# Patient Record
Sex: Female | Born: 1965
Health system: Southern US, Community
[De-identification: ages and names within clinical notes are randomized; demographics above are authoritative.]

## PROBLEM LIST (undated history)

## (undated) DIAGNOSIS — F32A Depression, unspecified: Secondary | ICD-10-CM

## (undated) DIAGNOSIS — N760 Acute vaginitis: Secondary | ICD-10-CM

## (undated) DIAGNOSIS — K5790 Diverticulosis of intestine, part unspecified, without perforation or abscess without bleeding: Secondary | ICD-10-CM

## (undated) DIAGNOSIS — E785 Hyperlipidemia, unspecified: Secondary | ICD-10-CM

## (undated) DIAGNOSIS — E079 Disorder of thyroid, unspecified: Secondary | ICD-10-CM

## (undated) DIAGNOSIS — J45909 Unspecified asthma, uncomplicated: Secondary | ICD-10-CM

## (undated) DIAGNOSIS — I10 Essential (primary) hypertension: Secondary | ICD-10-CM

## (undated) DIAGNOSIS — E039 Hypothyroidism, unspecified: Secondary | ICD-10-CM

## (undated) DIAGNOSIS — R238 Other skin changes: Secondary | ICD-10-CM

## (undated) DIAGNOSIS — F419 Anxiety disorder, unspecified: Secondary | ICD-10-CM

## (undated) DIAGNOSIS — Z309 Encounter for contraceptive management, unspecified: Secondary | ICD-10-CM

## (undated) DIAGNOSIS — N898 Other specified noninflammatory disorders of vagina: Principal | ICD-10-CM

## (undated) DIAGNOSIS — B9689 Other specified bacterial agents as the cause of diseases classified elsewhere: Secondary | ICD-10-CM

## (undated) DIAGNOSIS — M1990 Unspecified osteoarthritis, unspecified site: Secondary | ICD-10-CM

## (undated) DIAGNOSIS — M722 Plantar fascial fibromatosis: Secondary | ICD-10-CM

## (undated) DIAGNOSIS — K219 Gastro-esophageal reflux disease without esophagitis: Secondary | ICD-10-CM

## (undated) DIAGNOSIS — T7840XA Allergy, unspecified, initial encounter: Secondary | ICD-10-CM

## (undated) DIAGNOSIS — G2581 Restless legs syndrome: Secondary | ICD-10-CM

## (undated) DIAGNOSIS — E559 Vitamin D deficiency, unspecified: Secondary | ICD-10-CM

## (undated) HISTORY — PX: CARPAL TUNNEL RELEASE: SHX101

## (undated) HISTORY — DX: Other specified noninflammatory disorders of vagina: N89.8

## (undated) HISTORY — DX: Essential (primary) hypertension: I10

## (undated) HISTORY — DX: Gastro-esophageal reflux disease without esophagitis: K21.9

## (undated) HISTORY — DX: Unspecified osteoarthritis, unspecified site: M19.90

## (undated) HISTORY — PX: SHOULDER ARTHROSCOPY: SHX128

## (undated) HISTORY — DX: Plantar fascial fibromatosis: M72.2

## (undated) HISTORY — PX: OTHER SURGICAL HISTORY: SHX169

## (undated) HISTORY — DX: Vitamin D deficiency, unspecified: E55.9

## (undated) HISTORY — DX: Other skin changes: R23.8

## (undated) HISTORY — DX: Other specified bacterial agents as the cause of diseases classified elsewhere: B96.89

## (undated) HISTORY — DX: Hypothyroidism, unspecified: E03.9

## (undated) HISTORY — DX: Encounter for contraceptive management, unspecified: Z30.9

## (undated) HISTORY — PX: UPPER GASTROINTESTINAL ENDOSCOPY: SHX188

## (undated) HISTORY — PX: WISDOM TOOTH EXTRACTION: SHX21

## (undated) HISTORY — DX: Hyperlipidemia, unspecified: E78.5

## (undated) HISTORY — DX: Depression, unspecified: F32.A

## (undated) HISTORY — DX: Disorder of thyroid, unspecified: E07.9

## (undated) HISTORY — DX: Unspecified asthma, uncomplicated: J45.909

## (undated) HISTORY — DX: Diverticulosis of intestine, part unspecified, without perforation or abscess without bleeding: K57.90

## (undated) HISTORY — DX: Allergy, unspecified, initial encounter: T78.40XA

## (undated) HISTORY — DX: Anxiety disorder, unspecified: F41.9

## (undated) HISTORY — DX: Restless legs syndrome: G25.81

## (undated) HISTORY — PX: ELBOW ARTHROSCOPY: SUR87

## (undated) HISTORY — PX: THYROIDECTOMY: SHX17

## (undated) HISTORY — DX: Acute vaginitis: N76.0

## (undated) HISTORY — PX: COLONOSCOPY: SHX174

---

## 2001-07-10 ENCOUNTER — Other Ambulatory Visit: Admission: RE | Admit: 2001-07-10 | Discharge: 2001-07-10 | Payer: Self-pay | Admitting: Obstetrics and Gynecology

## 2003-12-04 ENCOUNTER — Encounter: Admission: RE | Admit: 2003-12-04 | Discharge: 2003-12-04 | Payer: Self-pay | Admitting: Gastroenterology

## 2004-08-17 ENCOUNTER — Ambulatory Visit (HOSPITAL_COMMUNITY): Admission: RE | Admit: 2004-08-17 | Discharge: 2004-08-17 | Payer: Self-pay | Admitting: Obstetrics and Gynecology

## 2004-10-11 DIAGNOSIS — K449 Diaphragmatic hernia without obstruction or gangrene: Secondary | ICD-10-CM

## 2004-10-11 HISTORY — DX: Diaphragmatic hernia without obstruction or gangrene: K44.9

## 2004-11-11 ENCOUNTER — Encounter (INDEPENDENT_AMBULATORY_CARE_PROVIDER_SITE_OTHER): Payer: Self-pay | Admitting: Specialist

## 2004-11-11 ENCOUNTER — Ambulatory Visit (HOSPITAL_COMMUNITY): Admission: RE | Admit: 2004-11-11 | Discharge: 2004-11-11 | Payer: Self-pay | Admitting: Gastroenterology

## 2005-05-11 ENCOUNTER — Encounter: Admission: RE | Admit: 2005-05-11 | Discharge: 2005-05-11 | Payer: Self-pay | Admitting: Family Medicine

## 2005-08-19 ENCOUNTER — Ambulatory Visit (HOSPITAL_COMMUNITY): Admission: RE | Admit: 2005-08-19 | Discharge: 2005-08-19 | Payer: Self-pay | Admitting: Obstetrics and Gynecology

## 2006-09-12 ENCOUNTER — Ambulatory Visit (HOSPITAL_COMMUNITY): Admission: RE | Admit: 2006-09-12 | Discharge: 2006-09-12 | Payer: Self-pay | Admitting: Obstetrics and Gynecology

## 2006-09-26 ENCOUNTER — Ambulatory Visit (HOSPITAL_COMMUNITY): Admission: RE | Admit: 2006-09-26 | Discharge: 2006-09-26 | Payer: Self-pay | Admitting: Obstetrics & Gynecology

## 2007-09-18 ENCOUNTER — Ambulatory Visit (HOSPITAL_COMMUNITY): Admission: RE | Admit: 2007-09-18 | Discharge: 2007-09-18 | Payer: Self-pay | Admitting: Obstetrics and Gynecology

## 2007-11-27 ENCOUNTER — Other Ambulatory Visit: Admission: RE | Admit: 2007-11-27 | Discharge: 2007-11-27 | Payer: Self-pay | Admitting: Obstetrics and Gynecology

## 2008-09-30 ENCOUNTER — Ambulatory Visit (HOSPITAL_COMMUNITY): Admission: RE | Admit: 2008-09-30 | Discharge: 2008-09-30 | Payer: Self-pay | Admitting: Obstetrics and Gynecology

## 2008-12-10 ENCOUNTER — Other Ambulatory Visit: Admission: RE | Admit: 2008-12-10 | Discharge: 2008-12-10 | Payer: Self-pay | Admitting: Obstetrics and Gynecology

## 2009-10-14 ENCOUNTER — Ambulatory Visit (HOSPITAL_COMMUNITY): Admission: RE | Admit: 2009-10-14 | Discharge: 2009-10-14 | Payer: Self-pay | Admitting: Orthopaedic Surgery

## 2010-02-04 ENCOUNTER — Other Ambulatory Visit: Admission: RE | Admit: 2010-02-04 | Discharge: 2010-02-04 | Payer: Self-pay | Admitting: Obstetrics and Gynecology

## 2010-02-20 ENCOUNTER — Encounter: Admission: RE | Admit: 2010-02-20 | Discharge: 2010-02-20 | Payer: Self-pay | Admitting: Internal Medicine

## 2010-05-08 ENCOUNTER — Encounter (INDEPENDENT_AMBULATORY_CARE_PROVIDER_SITE_OTHER): Payer: Self-pay | Admitting: Surgery

## 2010-05-08 ENCOUNTER — Observation Stay (HOSPITAL_COMMUNITY): Admission: AD | Admit: 2010-05-08 | Discharge: 2010-05-09 | Payer: Self-pay | Admitting: Surgery

## 2010-08-12 ENCOUNTER — Encounter: Admission: RE | Admit: 2010-08-12 | Discharge: 2010-08-12 | Payer: Self-pay | Admitting: Orthopaedic Surgery

## 2010-10-30 ENCOUNTER — Other Ambulatory Visit: Payer: Self-pay | Admitting: Obstetrics and Gynecology

## 2010-10-30 DIAGNOSIS — Z1239 Encounter for other screening for malignant neoplasm of breast: Secondary | ICD-10-CM

## 2010-11-30 ENCOUNTER — Ambulatory Visit (HOSPITAL_COMMUNITY)
Admission: RE | Admit: 2010-11-30 | Discharge: 2010-11-30 | Disposition: A | Discharge status: Home / Self Care | Payer: 59 | Source: Ambulatory Visit | Attending: Obstetrics and Gynecology | Admitting: Obstetrics and Gynecology

## 2010-11-30 DIAGNOSIS — Z1239 Encounter for other screening for malignant neoplasm of breast: Secondary | ICD-10-CM

## 2010-11-30 DIAGNOSIS — Z1231 Encounter for screening mammogram for malignant neoplasm of breast: Secondary | ICD-10-CM | POA: Insufficient documentation

## 2010-12-26 LAB — URINALYSIS, ROUTINE W REFLEX MICROSCOPIC
Glucose, UA: NEGATIVE mg/dL
Ketones, ur: NEGATIVE mg/dL
Protein, ur: NEGATIVE mg/dL
Urobilinogen, UA: 0.2 mg/dL (ref 0.0–1.0)
pH: 6 (ref 5.0–8.0)

## 2010-12-26 LAB — CALCIUM
Calcium: 7.9 mg/dL — ABNORMAL LOW (ref 8.4–10.5)
Calcium: 8.7 mg/dL (ref 8.4–10.5)

## 2010-12-26 LAB — CBC
HCT: 37.2 % (ref 36.0–46.0)
MCHC: 35.5 g/dL (ref 30.0–36.0)
Platelets: 436 10*3/uL — ABNORMAL HIGH (ref 150–400)
RDW: 12.5 % (ref 11.5–15.5)

## 2010-12-26 LAB — BASIC METABOLIC PANEL
Chloride: 104 mEq/L (ref 96–112)
GFR calc Af Amer: >60 mL/min (ref >60)
Glucose, Bld: 88 mg/dL (ref 70–99)
Potassium: 3.1 mEq/L — ABNORMAL LOW (ref 3.5–5.1)
Sodium: 138 mEq/L (ref 135–145)

## 2010-12-26 LAB — DIFFERENTIAL
Basophils Absolute: 0 10*3/uL (ref 0.0–0.1)
Basophils Relative: 1 % (ref 0–1)
Eosinophils Relative: 1 % (ref 0–5)
Lymphs Abs: 2.2 10*3/uL (ref 0.7–4.0)
Monocytes Relative: 6 % (ref 3–12)
Neutro Abs: 5.1 10*3/uL (ref 1.7–7.7)
Neutrophils Relative %: 65 % (ref 43–77)

## 2010-12-26 LAB — PROTIME-INR: Prothrombin Time: 12.5 seconds (ref 11.6–15.2)

## 2010-12-26 LAB — SURGICAL PCR SCREEN: Staphylococcus aureus: NEGATIVE

## 2011-01-05 IMAGING — US US SOFT TISSUE HEAD/NECK
1 series · 14 of 25 positions shown · non-contrast
Comparison: 

CLINICAL DATA: Follow-up multinodular goiter.  Dysphagia

THYROID ULTRASOUND
TECHNIQUE: Ultrasound examination of the thyroid gland and
adjacent soft tissues was performed.

[Series 1: us soft tissue head/neck · 0.09mm/px · 14 of 47 slices shown]
[im 1/47]
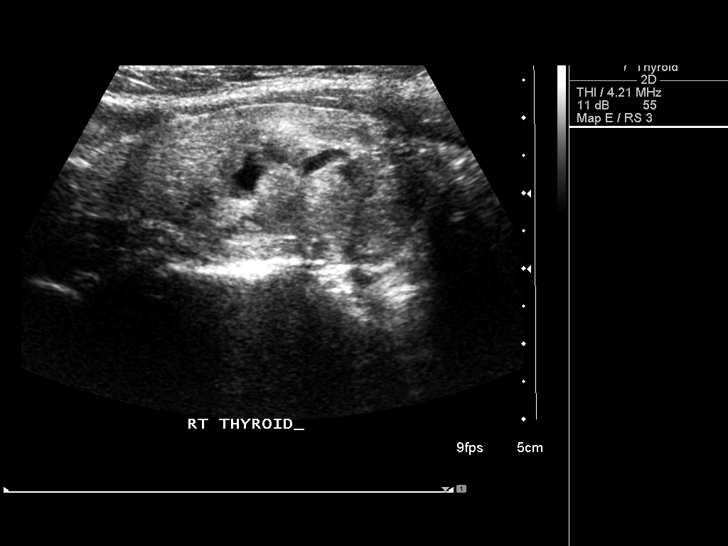
[im 4/47]
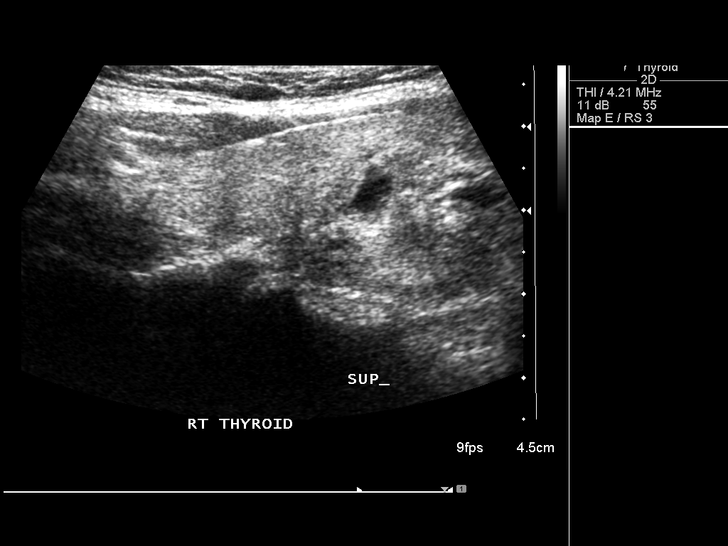
[im 8/47]
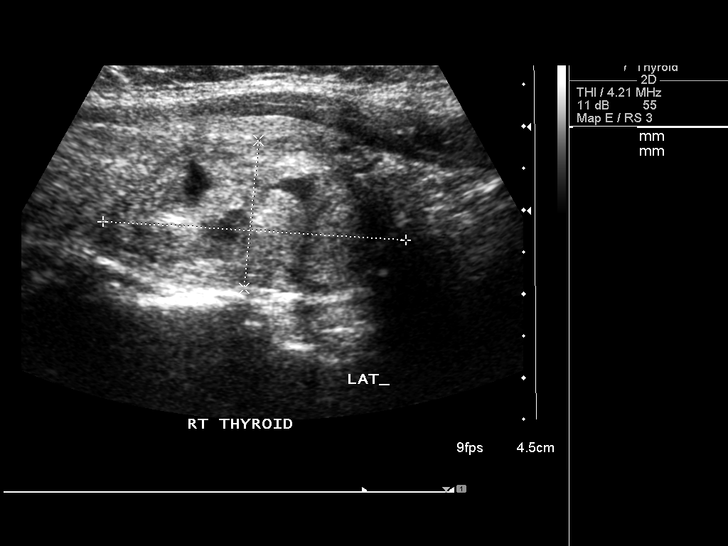
[im 12/47]
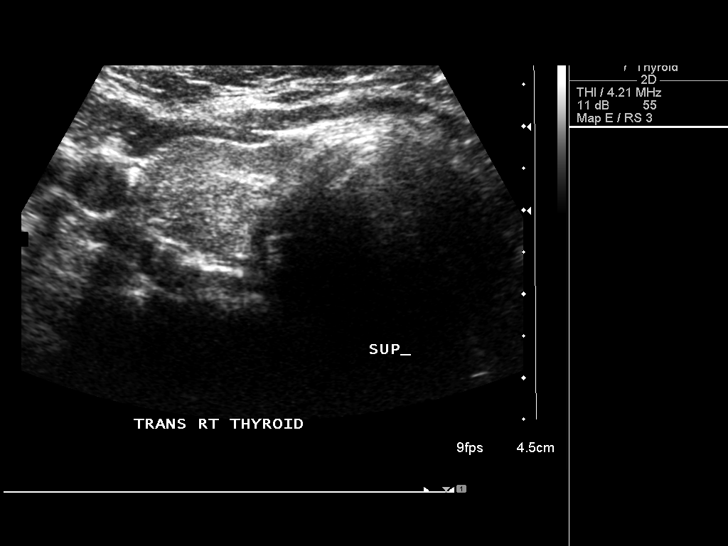
[im 16/47]
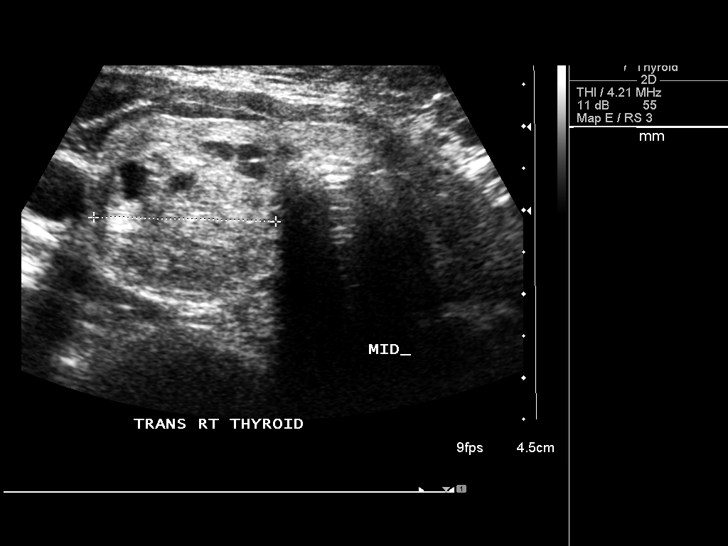
[im 18/47]
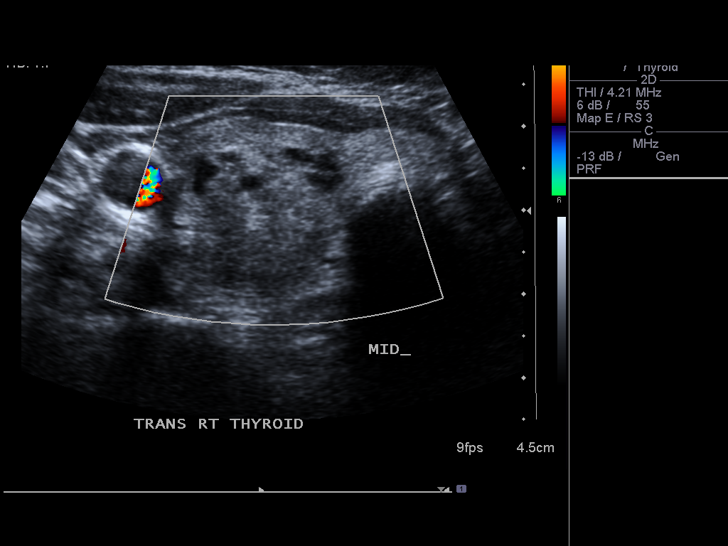
[im 22/47]
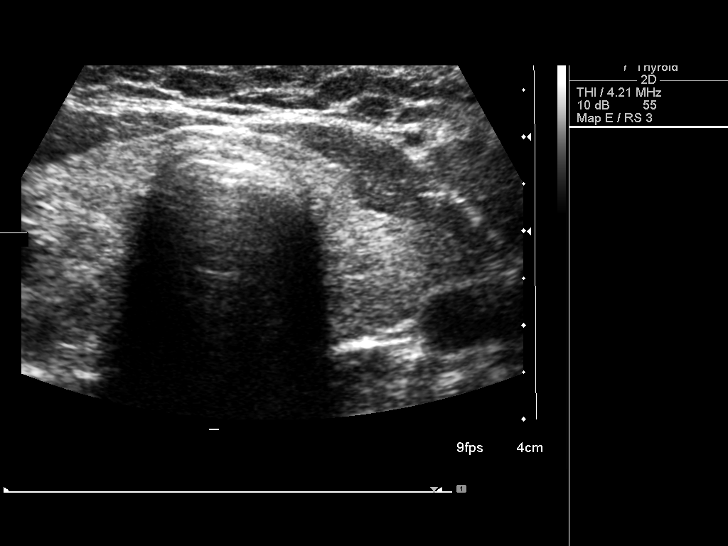
[im 25/47]
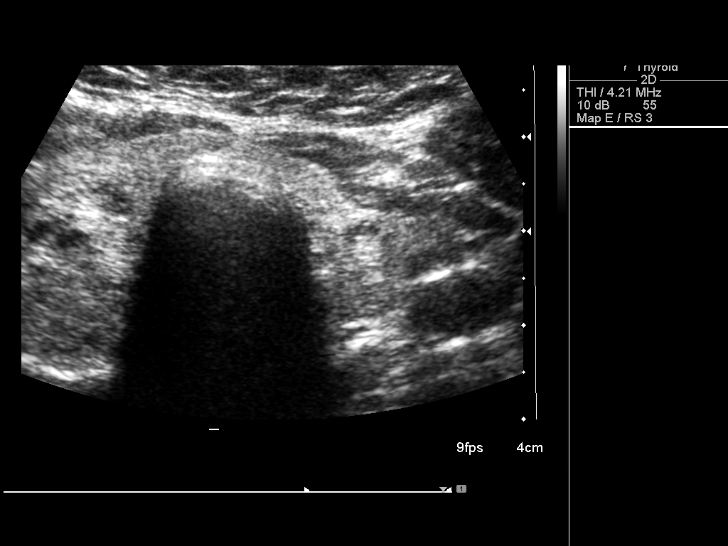
[im 29/47]
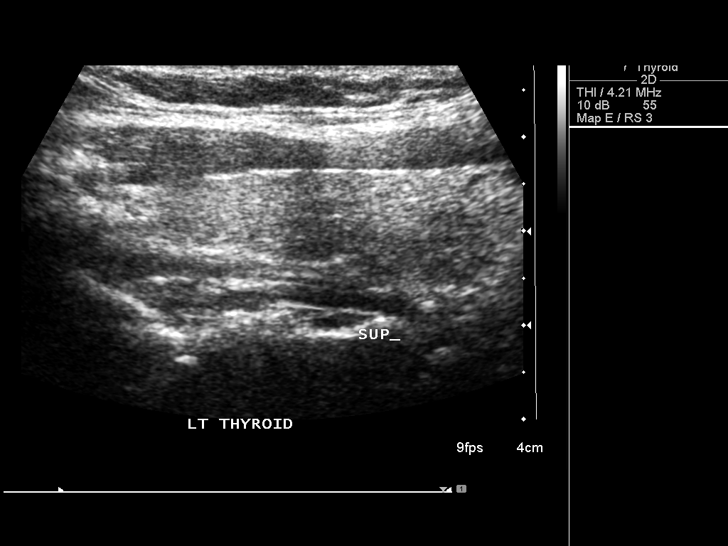
[im 31/47]
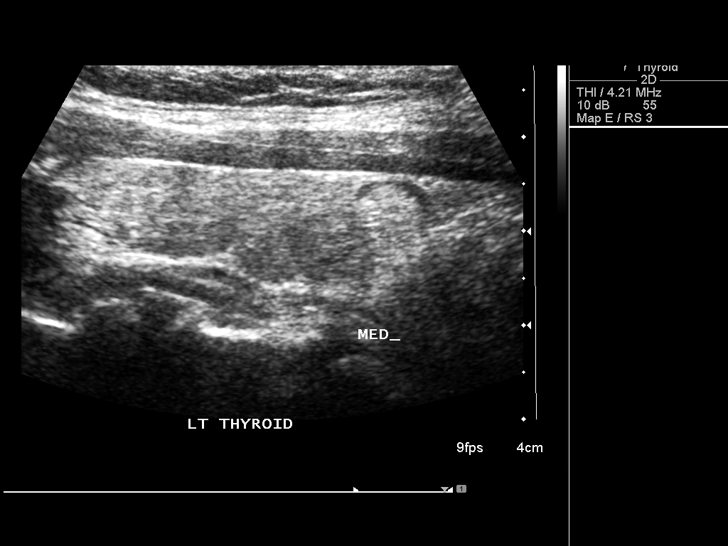
[im 35/47]
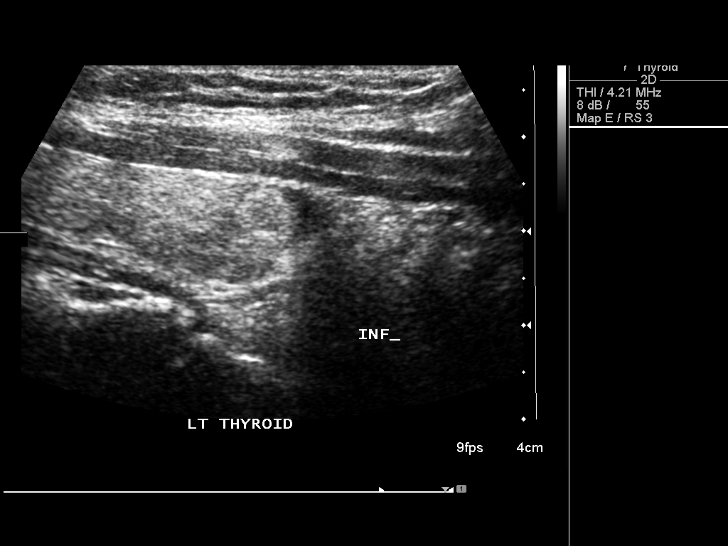
[im 39/47]
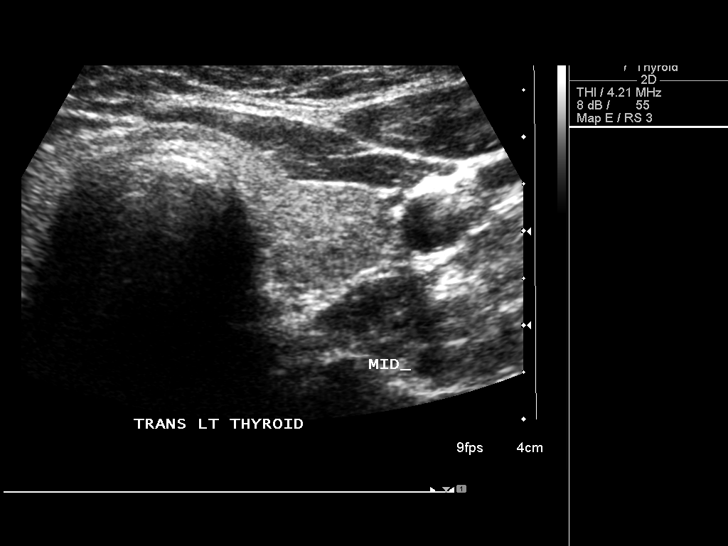
[im 43/47]
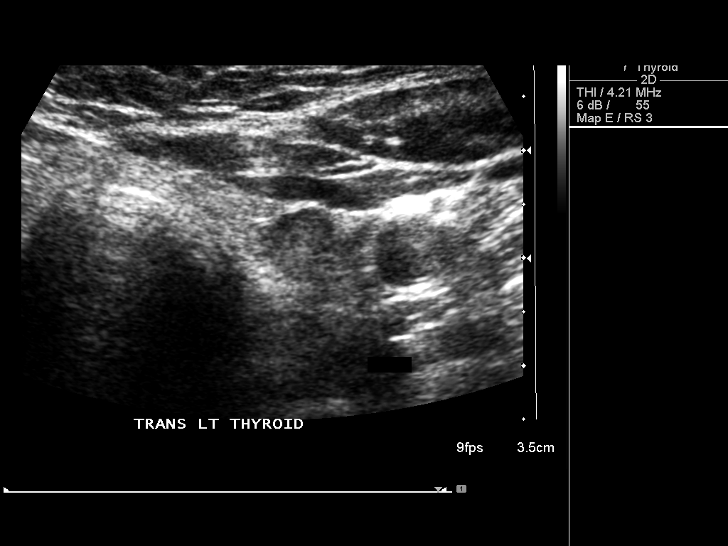
[im 47/47]
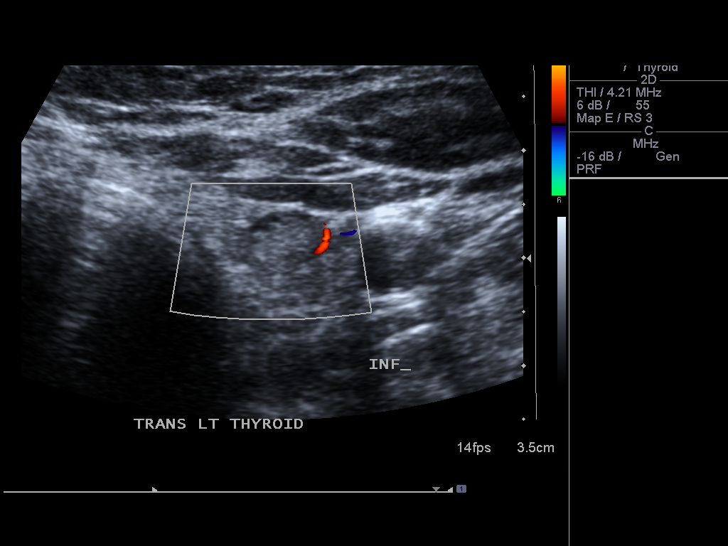

[14 of 25 positions shown; findings below may reference images not displayed]

FINDINGS: The right lobe of the thyroid measures 5.1 cm in length,
2.3 cm in depth and 2.4 cm in width for a total volume of
cubic centimeters.

The left lobe of the thyroid measures 5.1 cm in length, 1.4 cm in
depth and 1.6 cm in width for a total volume of 5.9 cubic
centimeters.

The isthmus appears normal with an AP depth of 3 mm.

There is a dominant heterogeneous mass again identified in the
lower pole of the right thyroid which measures 3.6 x 1.8 x
centimeters and this appears stable in comparison with prior exam.
A second small hyperechoic nodule with surrounding halo is seen in
the inferior pole of the left thyroid gland measuring 8 x 5.7 x
mm and this is also unchanged in comparison with prior exam.
IMPRESSION: Stable dominant mass in the inferior pole of the right thyroid
since 0886.  Stable small left thyroid nodule.  Unchanged thyroid
volume.

## 2011-02-26 NOTE — Op Note (Signed)
[name], Ann Foley               ACCOUNT NO.:  [id number]   MEDICAL RECORD NO.:  [id number]          PATIENT TYPE:  AMB   LOCATION:  ENDO                         FACILITY:  [facility]   PHYSICIAN:  Anselmo Rod, M.D.  DATE OF BIRTH:  [birth year]   DATE OF PROCEDURE:  11/11/2004  DATE OF DISCHARGE:                                 OPERATIVE REPORT   PROCEDURE PERFORMED:  Colonoscopy with random biopsies.   ENDOSCOPIST:  Anselmo Rod, M.D.   INSTRUMENT USED:  Olympus video colonoscope.   INDICATIONS FOR PROCEDURE:  Diarrhea in a 45 year old female to rule out  colonic polyps, masses, etc. Random colon biopsies planned to rule out  lymphocytic ___________.   PREPROCEDURE PREPARATION:  Informed consent was procured from the patient.  The patient was fasted for 8 hours prior to the procedure and prepped with a  bottle of magnesium citrate and a gallon of GoLYTELY the night prior to the  procedure. Risks and benefits of the procedure including a 10% miss rate of  cancer and polyps was discussed with the patient as well.   PREPROCEDURE PHYSICAL:  VITAL SIGNS:  The patient has stable vital signs.  NECK:  Supple.  CHEST:  Clear to auscultation. S1 and S2 regular.  ABDOMEN:  Soft with normal bowel sounds.   DESCRIPTION OF PROCEDURE:  The patient was placed in a left lateral  decubitus position and sedated with 100 mg of Demerol and 7.5 mg of Versed  in slow incremental doses. Once the patient was adequately sedated and  maintained on low flow oxygen and continuous cardiac monitoring, the Olympus  video colonoscope was advanced from the rectum to the cecum. The appendiceal  orifice and ileocecal valve were clearly visualized and photographed. The  terminal ileum appeared healthy and without lesions. Sigmoid diverticula  were noted with inspissated stool in several of the diverticula. Random  colon biopsies were done to rule out collagenous colitis. Retroflexion in  the rectum revealed  no abnormalities. The patient tolerated the procedure  well without immediate complications.   IMPRESSION:  1.  Sigmoid diverticulosis, otherwise unrevealing colonoscopy up to the      terminal ileum.  2.  Random colon biopsies done to rule out collagenous versus microscopic      colitis.   RECOMMENDATIONS:  1.  Await pathology results.  2.  Proceed with an EGD at this time.  3.  Further recommendations will be made thereafter.      JNM/MEDQ  D:  11/11/2004  T:  11/11/2004  Job:  [id number]   cc:   Schuyler Amor, M.D.  [address]  [place], [place] [address]  Fax: [phone]

## 2011-02-26 NOTE — Op Note (Signed)
Ann Foley, Ann Foley               ACCOUNT NO.:  [id number]   MEDICAL RECORD NO.:  [id number]          PATIENT TYPE:  AMB   LOCATION:  ENDO                         FACILITY:  [facility]   PHYSICIAN:  Anselmo Rod, M.D.  DATE OF BIRTH:  13-Mar-1966   DATE OF PROCEDURE:  11/11/2004  DATE OF DISCHARGE:                                 OPERATIVE REPORT   PROCEDURE PERFORMED:  Esophagogastroduodenoscopy with small bowel biopsies.   ENDOSCOPIST:  Charna Elizabeth, M.D.   INSTRUMENT USED:  Olympus video panendoscope.   INDICATIONS FOR PROCEDURE:  The patient is a 45 year old white female with a  history of diarrhea, rule out celiac sprue.  The patient had elevated  antigliadin antibody.   PREPROCEDURE PREPARATION:  Informed consent was procured from the patient.  The patient was fasted for eight hours prior to the procedure.   PREPROCEDURE PHYSICAL:  The patient had stable vital signs.  Neck supple,  chest clear to auscultation.  S1, S2 regular.  Abdomen soft with normal  bowel sounds.   DESCRIPTION OF PROCEDURE:  The patient was placed in the left lateral  decubitus position and sedated with an additional 2.5 mg of Versed in slow  incremental doses.  Once the patient was adequately sedated and maintained  on low-flow oxygen and continuous cardiac monitoring, the Olympus video  panendoscope was advanced through the mouth piece over the tongue into the  esophagus under direct vision.  The entire esophagus appeared normal with no  evidence of ring, stricture, masses, esophagitis or Barrett's mucosa.  The  scope was then advanced to the stomach.  A small hiatal hernia was seen on  high retroflexion.  There was mild antral gastritis noted.  No other ulcers,  erosions, masses or polyps were identified.  The proximal small bowel  appeared normal.  There was no outlet obstruction.  Small bowel biopsies  were done to rule out sprue.   IMPRESSION:  1.  Normal-appearing esophagus.  2.  Small hiatal  hernia.  3.  Mild antral gastritis.  4.  Normal proximal small bowel.  Biopsies done to rule out sprue.   RECOMMENDATIONS:  1.  Await pathology results.  2.  Outpatient followup in the next two weeks for further recommendations.      JNM/MEDQ  D:  11/11/2004  T:  11/11/2004  Job:  [id number]   cc:   Schuyler Amor, M.D.  [address]  [place], [place] [address]  Fax: [phone]

## 2011-05-12 ENCOUNTER — Other Ambulatory Visit: Payer: Self-pay | Admitting: Obstetrics and Gynecology

## 2011-08-02 ENCOUNTER — Other Ambulatory Visit: Payer: Self-pay | Admitting: Dermatology

## 2012-06-06 ENCOUNTER — Other Ambulatory Visit: Payer: Self-pay | Admitting: Adult Health

## 2012-06-06 ENCOUNTER — Other Ambulatory Visit (HOSPITAL_COMMUNITY)
Admission: RE | Admit: 2012-06-06 | Discharge: 2012-06-06 | Disposition: A | Discharge status: Home / Self Care | Payer: 59 | Source: Ambulatory Visit | Attending: Obstetrics and Gynecology | Admitting: Obstetrics and Gynecology

## 2012-06-06 DIAGNOSIS — Z01419 Encounter for gynecological examination (general) (routine) without abnormal findings: Secondary | ICD-10-CM | POA: Insufficient documentation

## 2012-06-06 DIAGNOSIS — Z1151 Encounter for screening for human papillomavirus (HPV): Secondary | ICD-10-CM | POA: Insufficient documentation

## 2012-07-13 ENCOUNTER — Other Ambulatory Visit: Payer: Self-pay | Admitting: Dermatology

## 2013-06-10 ENCOUNTER — Other Ambulatory Visit: Payer: Self-pay | Admitting: Adult Health

## 2013-06-27 ENCOUNTER — Other Ambulatory Visit: Payer: Self-pay | Admitting: Adult Health

## 2013-10-10 ENCOUNTER — Ambulatory Visit (INDEPENDENT_AMBULATORY_CARE_PROVIDER_SITE_OTHER): Payer: 59

## 2013-10-10 VITALS — Ht 66.5 in | Wt 210.0 lb

## 2013-10-10 DIAGNOSIS — R52 Pain, unspecified: Secondary | ICD-10-CM

## 2013-10-10 DIAGNOSIS — M722 Plantar fascial fibromatosis: Secondary | ICD-10-CM

## 2013-10-10 MED ORDER — MELOXICAM 15 MG PO TABS: 15.0000 mg | ORAL_TABLET | Freq: Every day | ORAL | Status: DC

## 2013-10-10 NOTE — Progress Notes (Signed)
   Subjective:    Patient ID: Ann Foley, female    DOB: [birth year], 47 y.o.   MRN: [id number]                            "I have Plantar Fasciitis and my right foot is killing me."  Foot Pain This is a recurrent (heel pain right) problem. Episode onset: 2 months. The problem occurs constantly. The problem has been gradually worsening. Exacerbated by: going barefoot. Treatments tried: wearing good shoes. The treatment provided mild relief.      Review of Systems  HENT: Positive for tinnitus.   Allergic/Immunologic: Positive for food allergies.  All other systems reviewed and are negative.       Objective:   Physical Exam Neurovascular status intact pedal pulses palpable. There is pain on palpation Magan plantar fascia medial calcaneal tubercle on the right heel. There is mild inferior calcaneal spur no known x-ray no fracture or other osseous abnormality noted. Patient had orthotics for many years ago they may be worn no longer functioning she does wear Finn Comfort and crocs which are beneficial however cannot wear them at work. Mild digital contractures noted no other osseous abnormalities noted radiographically.       Assessment & Plan:  Assessment plantar fasciitis/heel spur syndrome right foot fascial strapping applied at today's visit prescription for Mobic is dispensed to her pharmacy. Recommended ice to the heel every every evening. Maintain a good stable shoes such as crocs her Finn comfort reappointed in 2 weeks for followup may be candidate for you orthoses we'll bring her old orthoses with her  Alvan Dame DPM

## 2013-10-10 NOTE — Patient Instructions (Signed)

## 2013-10-24 ENCOUNTER — Ambulatory Visit (INDEPENDENT_AMBULATORY_CARE_PROVIDER_SITE_OTHER): Payer: 59

## 2013-10-24 VITALS — BP 116/77 | HR 73 | Resp 18

## 2013-10-24 DIAGNOSIS — R52 Pain, unspecified: Secondary | ICD-10-CM

## 2013-10-24 DIAGNOSIS — M722 Plantar fascial fibromatosis: Secondary | ICD-10-CM

## 2013-10-24 NOTE — Progress Notes (Signed)
   Subjective:    Patient ID: Ann Foley, female    DOB: May 12, 1966, 48 y.o.   MRN: [id number]  HPI I am a little better on my right heel and I bought the inserts so he could look at them     Review of Systems any changes or find     Objective:   Physical Exam Definite improvement with a fascial strapping and placed on the right inferior heel patient brought an old orthotics which are worn Morton's 50 years old likely need replacing at this time. The patient had improvement with the strapping at this time orthotics skin carried out for new functional orthoses maintain with orthotic for back to me refurbished the future. In the interim maintain NSAID therapy ice and the Foley such as a thin comfort or dance toe clogs.       Assessment & Plan:  Assessment this time persistent plantar fasciitis responding to fascial strapping should respond to functional orthoses new orthotics skin carried out at this time we'll followup in 4-5 weeks when orthotics are ready for fitting.  Harriet Masson DPM

## 2013-10-24 NOTE — Patient Instructions (Signed)

## 2013-11-21 ENCOUNTER — Ambulatory Visit (INDEPENDENT_AMBULATORY_CARE_PROVIDER_SITE_OTHER): Payer: 59

## 2013-11-21 VITALS — BP 110/68 | HR 95 | Resp 18

## 2013-11-21 DIAGNOSIS — R52 Pain, unspecified: Secondary | ICD-10-CM

## 2013-11-21 DIAGNOSIS — M722 Plantar fascial fibromatosis: Secondary | ICD-10-CM

## 2013-11-21 NOTE — Progress Notes (Signed)
° °  Subjective:    Patient ID: Ann Foley, female    DOB: 04-19-1966, 48 y.o.   MRN: [id number]  HPI I came to get my inserts and I am doing good on both feet    Review of Systems no changes     Objective:   Physical Exam        Assessment & Plan:  Patient's orthotics were dispensed with oral and written wear and break in instructions. Patient followup in a month or 2 if any symptoms or need for adjustments  Cranford Mon

## 2013-11-21 NOTE — Patient Instructions (Signed)

## 2013-11-29 ENCOUNTER — Ambulatory Visit (INDEPENDENT_AMBULATORY_CARE_PROVIDER_SITE_OTHER): Payer: 59 | Admitting: Adult Health

## 2013-11-29 ENCOUNTER — Encounter (INDEPENDENT_AMBULATORY_CARE_PROVIDER_SITE_OTHER): Payer: Self-pay

## 2013-11-29 ENCOUNTER — Encounter: Payer: Self-pay | Admitting: Adult Health

## 2013-11-29 VITALS — BP 126/90 | Ht 61.0 in | Wt 221.0 lb

## 2013-11-29 DIAGNOSIS — B9689 Other specified bacterial agents as the cause of diseases classified elsewhere: Secondary | ICD-10-CM

## 2013-11-29 DIAGNOSIS — N760 Acute vaginitis: Secondary | ICD-10-CM | POA: Insufficient documentation

## 2013-11-29 DIAGNOSIS — A499 Bacterial infection, unspecified: Secondary | ICD-10-CM

## 2013-11-29 DIAGNOSIS — N898 Other specified noninflammatory disorders of vagina: Secondary | ICD-10-CM | POA: Insufficient documentation

## 2013-11-29 HISTORY — DX: Other specified noninflammatory disorders of vagina: N89.8

## 2013-11-29 HISTORY — DX: Other specified bacterial agents as the cause of diseases classified elsewhere: B96.89

## 2013-11-29 HISTORY — DX: Other specified bacterial agents as the cause of diseases classified elsewhere: N76.0

## 2013-11-29 LAB — POCT WET PREP (WET MOUNT): TRICHOMONAS WET PREP HPF POC: NEGATIVE

## 2013-11-29 MED ORDER — METRONIDAZOLE 500 MG PO TABS: 500.0000 mg | ORAL_TABLET | Freq: Two times a day (BID) | ORAL | Status: DC

## 2013-11-29 NOTE — Progress Notes (Signed)
Subjective:     Patient ID: Ann Foley, female   DOB: January 20, 1966, 48 y.o.   MRN: [id number]  HPI Ann Foley is a 48 year old white female sinlge in coming of discharge with odor, just got back from [place], on Monday.Had yeast about 1 month ago and used monistat, has not had sex in about 5 years.Has no itching or pain.  Review of Systems See HPI Reviewed past medical,surgical, social and family history. Reviewed medications and allergies.     Objective:   Physical Exam BP 126/90  Ht 5\' 1"  (1.549 m)  Wt 221 lb (100.245 kg)  BMI 41.78 kg/m2  LMP 11/18/2013   Skin warm and dry.Pelvic: external genitalia is normal in appearance, vagina: white discharge with odor, cervix:smooth and irritated, uterus: normal size, shape and contour, non tender, no masses felt, adnexa: no masses or tenderness noted. Wet prep: + for clue cells and +WBCs.  Assessment:    Vaginal discharge BV    Plan:     Rx flagyl 500 mg 1 bid x 7 days, no alcohol, review handout on BV   Follow up prn

## 2013-11-29 NOTE — Patient Instructions (Signed)
Bacterial Vaginosis Bacterial vaginosis is a vaginal infection that occurs when the normal balance of bacteria in the vagina is disrupted. It results from an overgrowth of certain bacteria. This is the most common vaginal infection in women of childbearing age. Treatment is important to prevent complications, especially in pregnant women, as it can cause a premature delivery. CAUSES  Bacterial vaginosis is caused by an increase in harmful bacteria that are normally present in smaller amounts in the vagina. Several different kinds of bacteria can cause bacterial vaginosis. However, the reason that the condition develops is not fully understood. RISK FACTORS Certain activities or behaviors can put you at an increased risk of developing bacterial vaginosis, including:  Having a new sex partner or multiple sex partners.  Douching.  Using an intrauterine device (IUD) for contraception. Women do not get bacterial vaginosis from toilet seats, bedding, swimming pools, or contact with objects around them. SIGNS AND SYMPTOMS  Some women with bacterial vaginosis have no signs or symptoms. Common symptoms include:  Grey vaginal discharge.  A fishlike odor with discharge, especially after sexual intercourse.  Itching or burning of the vagina and vulva.  Burning or pain with urination. DIAGNOSIS  Your health care provider will take a medical history and examine the vagina for signs of bacterial vaginosis. A sample of vaginal fluid may be taken. Your health care provider will look at this sample under a microscope to check for bacteria and abnormal cells. A vaginal pH test may also be done.  TREATMENT  Bacterial vaginosis may be treated with antibiotic medicines. These may be given in the form of a pill or a vaginal cream. A second round of antibiotics may be prescribed if the condition comes back after treatment.  HOME CARE INSTRUCTIONS   Only take over-the-counter or prescription medicines as  directed by your health care provider.  If antibiotic medicine was prescribed, take it as directed. Make sure you finish it even if you start to feel better.  Do not have sex until treatment is completed.  Tell all sexual partners that you have a vaginal infection. They should see their health care provider and be treated if they have problems, such as a mild rash or itching.  Practice safe sex by using condoms and only having one sex partner. SEEK MEDICAL CARE IF:   Your symptoms are not improving after 3 days of treatment.  You have increased discharge or pain.  You have a fever. MAKE SURE YOU:   Understand these instructions.  Will watch your condition.  Will get help right away if you are not doing well or get worse. FOR MORE INFORMATION  Centers for Disease Control and Prevention, Division of STD Prevention: AppraiserFraud.fi American Sexual Health Association (ASHA): www.ashastd.org  Document Released: 09/27/2005 Document Revised: 07/18/2013 Document Reviewed: 05/09/2013 [facility] Patient Information 2014 [place]. Take flagyl No alcohol Follow up prn

## 2013-12-19 ENCOUNTER — Other Ambulatory Visit: Payer: Self-pay | Admitting: Orthopaedic Surgery

## 2013-12-19 DIAGNOSIS — M25519 Pain in unspecified shoulder: Secondary | ICD-10-CM

## 2013-12-23 ENCOUNTER — Ambulatory Visit
Admission: RE | Admit: 2013-12-23 | Discharge: 2013-12-23 | Disposition: A | Discharge status: Home / Self Care | Payer: 59 | Source: Ambulatory Visit | Attending: Orthopaedic Surgery | Admitting: Orthopaedic Surgery

## 2013-12-23 DIAGNOSIS — M25519 Pain in unspecified shoulder: Secondary | ICD-10-CM

## 2014-05-09 ENCOUNTER — Other Ambulatory Visit: Payer: Self-pay | Admitting: Adult Health

## 2014-08-12 ENCOUNTER — Encounter: Payer: Self-pay | Admitting: Adult Health

## 2014-09-12 ENCOUNTER — Other Ambulatory Visit: Payer: Self-pay | Admitting: Obstetrics and Gynecology

## 2014-09-12 DIAGNOSIS — Z1231 Encounter for screening mammogram for malignant neoplasm of breast: Secondary | ICD-10-CM

## 2014-10-07 ENCOUNTER — Ambulatory Visit (HOSPITAL_COMMUNITY)
Admission: RE | Admit: 2014-10-07 | Discharge: 2014-10-07 | Disposition: A | Discharge status: Home / Self Care | Payer: 59 | Source: Ambulatory Visit | Attending: Obstetrics and Gynecology | Admitting: Obstetrics and Gynecology

## 2014-10-07 DIAGNOSIS — Z1231 Encounter for screening mammogram for malignant neoplasm of breast: Secondary | ICD-10-CM | POA: Insufficient documentation

## 2014-12-03 ENCOUNTER — Ambulatory Visit (INDEPENDENT_AMBULATORY_CARE_PROVIDER_SITE_OTHER): Payer: 59 | Admitting: Adult Health

## 2014-12-03 ENCOUNTER — Encounter: Payer: Self-pay | Admitting: Adult Health

## 2014-12-03 VITALS — BP 140/80 | HR 76 | Ht 66.5 in | Wt 214.6 lb

## 2014-12-03 DIAGNOSIS — N898 Other specified noninflammatory disorders of vagina: Secondary | ICD-10-CM

## 2014-12-03 DIAGNOSIS — N760 Acute vaginitis: Secondary | ICD-10-CM

## 2014-12-03 DIAGNOSIS — B9689 Other specified bacterial agents as the cause of diseases classified elsewhere: Secondary | ICD-10-CM

## 2014-12-03 DIAGNOSIS — A499 Bacterial infection, unspecified: Secondary | ICD-10-CM

## 2014-12-03 MED ORDER — METRONIDAZOLE 500 MG PO TABS: 500.0000 mg | ORAL_TABLET | Freq: Two times a day (BID) | ORAL | Status: DC

## 2014-12-03 NOTE — Progress Notes (Signed)
Subjective:     Patient ID: Ann Foley, female   DOB: April 11, 1966, 49 y.o.   MRN: [id number]  HPI Ann Foley is a 49 year old white female in complaining of vaginal discharge with irritation and itching and has noticed an odor.She says its like when she had BV in past.No new sex partners.Is still working 2 jobs.  Review of Systems +vaginal discharge with odor and itch, all other systems negative  Reviewed past medical,surgical, social and family history. Reviewed medications and allergies.     Objective:   Physical Exam BP 140/80 mmHg  Pulse 76  Ht 5' 6.5" (1.689 m)  Wt 214 lb 9.6 oz (97.342 kg)  BMI 34.12 kg/m2  LMP 11/26/2014  Skin warm and dry.Pelvic: external genitalia is normal in appearance no lesions, vagina: white,frothy discharge with slight odor,urethra has no lesions or masses noted, cervix:smooth uterus: normal size, shape and contour, non tender, no masses felt, adnexa: no masses or tenderness noted. Bladder is non tender and no masses felt. Wet prep: + for clue cells and +WBCs.     Assessment:     Vaginal discharge BV    Plan:     Rx flagyl 500 mg 1 bid x 7 days, no alcohol, review handout on BV   Return in August for pap and physical

## 2014-12-03 NOTE — Patient Instructions (Signed)
Bacterial Vaginosis Bacterial vaginosis is a vaginal infection that occurs when the normal balance of bacteria in the vagina is disrupted. It results from an overgrowth of certain bacteria. This is the most common vaginal infection in women of childbearing age. Treatment is important to prevent complications, especially in pregnant women, as it can cause a premature delivery. CAUSES  Bacterial vaginosis is caused by an increase in harmful bacteria that are normally present in smaller amounts in the vagina. Several different kinds of bacteria can cause bacterial vaginosis. However, the reason that the condition develops is not fully understood. RISK FACTORS Certain activities or behaviors can put you at an increased risk of developing bacterial vaginosis, including:  Having a new sex partner or multiple sex partners.  Douching.  Using an intrauterine device (IUD) for contraception. Women do not get bacterial vaginosis from toilet seats, bedding, swimming pools, or contact with objects around them. SIGNS AND SYMPTOMS  Some women with bacterial vaginosis have no signs or symptoms. Common symptoms include:  Grey vaginal discharge.  A fishlike odor with discharge, especially after sexual intercourse.  Itching or burning of the vagina and vulva.  Burning or pain with urination. DIAGNOSIS  Your health care provider will take a medical history and examine the vagina for signs of bacterial vaginosis. A sample of vaginal fluid may be taken. Your health care provider will look at this sample under a microscope to check for bacteria and abnormal cells. A vaginal pH test may also be done.  TREATMENT  Bacterial vaginosis may be treated with antibiotic medicines. These may be given in the form of a pill or a vaginal cream. A second round of antibiotics may be prescribed if the condition comes back after treatment.  HOME CARE INSTRUCTIONS   Only take over-the-counter or prescription medicines as  directed by your health care provider.  If antibiotic medicine was prescribed, take it as directed. Make sure you finish it even if you start to feel better.  Do not have sex until treatment is completed.  Tell all sexual partners that you have a vaginal infection. They should see their health care provider and be treated if they have problems, such as a mild rash or itching.  Practice safe sex by using condoms and only having one sex partner. SEEK MEDICAL CARE IF:   Your symptoms are not improving after 3 days of treatment.  You have increased discharge or pain.  You have a fever. MAKE SURE YOU:   Understand these instructions.  Will watch your condition.  Will get help right away if you are not doing well or get worse. FOR MORE INFORMATION  Centers for Disease Control and Prevention, Division of STD Prevention: AppraiserFraud.fi American Sexual Health Association (ASHA): www.ashastd.org  Document Released: 09/27/2005 Document Revised: 07/18/2013 Document Reviewed: 05/09/2013 [facility] Patient Information 2015 [place], [place]. This information is not intended to replace advice given to you by your health care provider. Make sure you discuss any questions you have with your health care provider. Take flagyl no alcohol Return in august for pap and physical

## 2015-04-10 ENCOUNTER — Other Ambulatory Visit: Payer: Self-pay | Admitting: Adult Health

## 2015-05-07 ENCOUNTER — Other Ambulatory Visit (HOSPITAL_COMMUNITY)
Admission: RE | Admit: 2015-05-07 | Discharge: 2015-05-07 | Disposition: A | Discharge status: Home / Self Care | Payer: 59 | Source: Ambulatory Visit | Attending: Adult Health | Admitting: Adult Health

## 2015-05-07 ENCOUNTER — Ambulatory Visit (INDEPENDENT_AMBULATORY_CARE_PROVIDER_SITE_OTHER): Payer: 59 | Admitting: Adult Health

## 2015-05-07 ENCOUNTER — Encounter: Payer: Self-pay | Admitting: Adult Health

## 2015-05-07 VITALS — BP 140/86 | HR 84 | Ht 64.5 in | Wt 219.0 lb

## 2015-05-07 DIAGNOSIS — Z1151 Encounter for screening for human papillomavirus (HPV): Secondary | ICD-10-CM | POA: Insufficient documentation

## 2015-05-07 DIAGNOSIS — R87810 Cervical high risk human papillomavirus (HPV) DNA test positive: Secondary | ICD-10-CM | POA: Diagnosis present

## 2015-05-07 DIAGNOSIS — Z309 Encounter for contraceptive management, unspecified: Secondary | ICD-10-CM

## 2015-05-07 DIAGNOSIS — Z3041 Encounter for surveillance of contraceptive pills: Secondary | ICD-10-CM

## 2015-05-07 DIAGNOSIS — Z113 Encounter for screening for infections with a predominantly sexual mode of transmission: Secondary | ICD-10-CM

## 2015-05-07 DIAGNOSIS — Z01419 Encounter for gynecological examination (general) (routine) without abnormal findings: Secondary | ICD-10-CM | POA: Insufficient documentation

## 2015-05-07 DIAGNOSIS — E039 Hypothyroidism, unspecified: Secondary | ICD-10-CM

## 2015-05-07 DIAGNOSIS — Z1211 Encounter for screening for malignant neoplasm of colon: Secondary | ICD-10-CM | POA: Diagnosis not present

## 2015-05-07 DIAGNOSIS — R238 Other skin changes: Secondary | ICD-10-CM

## 2015-05-07 HISTORY — DX: Other skin changes: R23.8

## 2015-05-07 HISTORY — DX: Encounter for contraceptive management, unspecified: Z30.9

## 2015-05-07 LAB — HEMOCCULT GUIAC POC 1CARD (OFFICE): FECAL OCCULT BLD: NEGATIVE

## 2015-05-07 MED ORDER — VALACYCLOVIR HCL 1 G PO TABS: 1000.0000 mg | ORAL_TABLET | Freq: Two times a day (BID) | ORAL | Status: DC

## 2015-05-07 NOTE — Patient Instructions (Signed)
Physical in 1 year Mammogram yearly Colonoscopy due  Will talk when labs back

## 2015-05-07 NOTE — Progress Notes (Signed)
Patient ID: Ann Foley, female   DOB: 01-18-1966, 49 y.o.   MRN: [id number] History of Present Illness:  Ann Foley is a 49 year old white female, single in for well woman gyn exam and pap.Last pap 06/06/12 was normal with negative HPV. PCP is in [place].  Current Medications, Allergies, Past Medical History, Past Surgical History, Family History and Social History were reviewed in [employer] record.     Review of Systems: Patient denies any headaches, hearing loss, fatigue, blurred vision, shortness of breath, chest pain, abdominal pain, problems with bowel movements, urination, or intercourse(not having sex at present). No joint pain or mood swings.She does complain of sores at clitoris area, thinks its yeast, as it itches.Has no known history of herpes.    Physical Exam:BP 140/86 mmHg  Pulse 84  Ht 5' 4.5" (1.638 m)  Wt 219 lb (99.338 kg)  BMI 37.02 kg/m2  LMP 04/01/2015 General:  Well developed, well nourished, no acute distress Skin:  Warm and dry Neck:  Midline trachea, normal thyroid, good ROM, no lymphadenopathy Lungs; Clear to auscultation bilaterally Breast:  No dominant palpable mass, retraction, or nipple discharge Cardiovascular: Regular rate and rhythm Abdomen:  Soft, non tender, no hepatosplenomegaly Pelvic:  External genitalia is normal in appearance, except has several small vesicles to left of clitoris, HSV culture obtained.  The vagina is normal in appearance. Urethra has no lesions or masses. The cervix is smooth, pap with HPV performed.  Uterus is felt to be normal size, shape, and contour.  No adnexal masses or tenderness noted.Bladder is non tender, no masses felt. Rectal: Good sphincter tone, no polyps, or hemorrhoids felt.  Hemoccult negative.mild rectocele Extremities/musculoskeletal:  No swelling or varicosities noted, no clubbing or cyanosis Psych:  No mood changes, alert and cooperative,seems happy Discussed with her could be herpes  or may not be, could be where she scratched, but wil check to make sure.  Impression: Well woman gyn exam with pap Vesicles Contraceptive management STD screening Hypothyroid      Plan: Check CBC,CMP,TSH and lipids,HIV,RPR and HSV 2, will talk when labs back HSV 2 culture sent Rx valtrex 1 gm #20 take 1 bid x 10 days with 3 refills Has refills on OCs Physical in 1 year, pap in 3 if normal with negative HPV Mammogram yearly Colonoscopy advised now

## 2015-05-08 ENCOUNTER — Telehealth: Payer: Self-pay | Admitting: Adult Health

## 2015-05-08 LAB — LIPID PANEL
CHOL/HDL RATIO: 2.8 ratio (ref 0.0–4.4)
CHOLESTEROL TOTAL: 205 mg/dL — AB (ref 100–199)
HDL: 73 mg/dL (ref >39)
LDL CALC: 112 mg/dL — AB (ref 0–99)
Triglycerides: 101 mg/dL (ref 0–149)
VLDL Cholesterol Cal: 20 mg/dL (ref 5–40)

## 2015-05-08 LAB — RPR: RPR Ser Ql: NONREACTIVE

## 2015-05-08 LAB — COMPREHENSIVE METABOLIC PANEL
ALBUMIN: 4.1 g/dL (ref 3.5–5.5)
ALT: 17 IU/L (ref 0–32)
AST: 19 IU/L (ref 0–40)
Albumin/Globulin Ratio: 1.5 (ref 1.1–2.5)
Alkaline Phosphatase: 59 IU/L (ref 39–117)
BUN/Creatinine Ratio: 18 (ref 9–23)
BUN: 14 mg/dL (ref 6–24)
Bilirubin Total: <0.2 mg/dL (ref 0.0–1.2)
CHLORIDE: 98 mmol/L (ref 97–108)
CO2: 21 mmol/L (ref 18–29)
CREATININE: 0.77 mg/dL (ref 0.57–1.00)
Calcium: 8.8 mg/dL (ref 8.7–10.2)
GFR calc Af Amer: 104 mL/min/{1.73_m2} (ref >59)
GFR, EST NON AFRICAN AMERICAN: 90 mL/min/{1.73_m2} (ref >59)
GLOBULIN, TOTAL: 2.7 g/dL (ref 1.5–4.5)
Glucose: 89 mg/dL (ref 65–99)
Potassium: 4.2 mmol/L (ref 3.5–5.2)
SODIUM: 139 mmol/L (ref 134–144)
Total Protein: 6.8 g/dL (ref 6.0–8.5)

## 2015-05-08 LAB — CBC
Hematocrit: 38.5 % (ref 34.0–46.6)
Hemoglobin: 13.2 g/dL (ref 11.1–15.9)
MCH: 30.2 pg (ref 26.6–33.0)
MCHC: 34.3 g/dL (ref 31.5–35.7)
MCV: 88 fL (ref 79–97)
PLATELETS: 476 10*3/uL — AB (ref 150–379)
RBC: 4.37 x10E6/uL (ref 3.77–5.28)
RDW: 12.8 % (ref 12.3–15.4)
WBC: 5.7 10*3/uL (ref 3.4–10.8)

## 2015-05-08 LAB — HIV ANTIBODY (ROUTINE TESTING W REFLEX): HIV SCREEN 4TH GENERATION: NONREACTIVE

## 2015-05-08 LAB — CYTOLOGY - PAP

## 2015-05-08 LAB — TSH: TSH: 1.05 u[IU]/mL (ref 0.450–4.500)

## 2015-05-08 LAB — HSV 2 ANTIBODY, IGG

## 2015-05-08 NOTE — Telephone Encounter (Signed)
Left message that labs looked good and STD tests were negative

## 2015-05-10 LAB — HERPES SIMPLEX VIRUS CULTURE

## 2015-05-12 ENCOUNTER — Telehealth: Payer: Self-pay | Admitting: Adult Health

## 2015-05-12 MED ORDER — FLUCONAZOLE 150 MG PO TABS: 150.0000 mg | ORAL_TABLET | Freq: Once | ORAL | Status: DC

## 2015-05-12 NOTE — Telephone Encounter (Signed)
Left message to call about pap and culture

## 2015-05-12 NOTE — Telephone Encounter (Signed)
Pt aware.

## 2015-05-12 NOTE — Telephone Encounter (Signed)
Pt aware of HSV culture negative and that pap +HPV and yeast, will rx diflucan and repeat pap in 1 year

## 2015-12-08 ENCOUNTER — Other Ambulatory Visit: Payer: Self-pay | Admitting: Obstetrics and Gynecology

## 2015-12-08 DIAGNOSIS — Z1231 Encounter for screening mammogram for malignant neoplasm of breast: Secondary | ICD-10-CM

## 2015-12-15 ENCOUNTER — Ambulatory Visit (HOSPITAL_COMMUNITY): Payer: 59

## 2015-12-16 ENCOUNTER — Other Ambulatory Visit: Payer: Self-pay | Admitting: Obstetrics and Gynecology

## 2015-12-16 DIAGNOSIS — Z1231 Encounter for screening mammogram for malignant neoplasm of breast: Secondary | ICD-10-CM

## 2015-12-22 ENCOUNTER — Ambulatory Visit (HOSPITAL_COMMUNITY)
Admission: RE | Admit: 2015-12-22 | Discharge: 2015-12-22 | Disposition: A | Discharge status: Home / Self Care | Payer: 59 | Source: Ambulatory Visit | Attending: Obstetrics and Gynecology | Admitting: Obstetrics and Gynecology

## 2015-12-22 DIAGNOSIS — Z1231 Encounter for screening mammogram for malignant neoplasm of breast: Secondary | ICD-10-CM | POA: Diagnosis present

## 2016-02-27 ENCOUNTER — Other Ambulatory Visit: Payer: Self-pay | Admitting: Emergency Medicine

## 2016-02-27 DIAGNOSIS — R609 Edema, unspecified: Secondary | ICD-10-CM

## 2016-02-27 DIAGNOSIS — R52 Pain, unspecified: Secondary | ICD-10-CM

## 2016-03-01 ENCOUNTER — Ambulatory Visit
Admission: RE | Admit: 2016-03-01 | Discharge: 2016-03-01 | Disposition: A | Discharge status: Home / Self Care | Payer: 59 | Source: Ambulatory Visit | Attending: Emergency Medicine | Admitting: Emergency Medicine

## 2016-03-01 DIAGNOSIS — R609 Edema, unspecified: Secondary | ICD-10-CM

## 2016-03-01 DIAGNOSIS — R52 Pain, unspecified: Secondary | ICD-10-CM

## 2016-03-20 ENCOUNTER — Other Ambulatory Visit: Payer: Self-pay | Admitting: Adult Health

## 2016-10-12 DIAGNOSIS — E7800 Pure hypercholesterolemia, unspecified: Secondary | ICD-10-CM | POA: Diagnosis not present

## 2016-10-12 DIAGNOSIS — E538 Deficiency of other specified B group vitamins: Secondary | ICD-10-CM | POA: Diagnosis not present

## 2016-10-12 DIAGNOSIS — R7301 Impaired fasting glucose: Secondary | ICD-10-CM | POA: Diagnosis not present

## 2016-11-01 DIAGNOSIS — R7301 Impaired fasting glucose: Secondary | ICD-10-CM | POA: Diagnosis not present

## 2016-11-01 DIAGNOSIS — E538 Deficiency of other specified B group vitamins: Secondary | ICD-10-CM | POA: Diagnosis not present

## 2016-11-01 DIAGNOSIS — E039 Hypothyroidism, unspecified: Secondary | ICD-10-CM | POA: Diagnosis not present

## 2016-11-22 DIAGNOSIS — E538 Deficiency of other specified B group vitamins: Secondary | ICD-10-CM | POA: Diagnosis not present

## 2016-11-22 DIAGNOSIS — E039 Hypothyroidism, unspecified: Secondary | ICD-10-CM | POA: Diagnosis not present

## 2016-11-22 DIAGNOSIS — R7301 Impaired fasting glucose: Secondary | ICD-10-CM | POA: Diagnosis not present

## 2016-11-24 DIAGNOSIS — E559 Vitamin D deficiency, unspecified: Secondary | ICD-10-CM | POA: Diagnosis not present

## 2016-11-24 DIAGNOSIS — E890 Postprocedural hypothyroidism: Secondary | ICD-10-CM | POA: Diagnosis not present

## 2016-11-24 DIAGNOSIS — I10 Essential (primary) hypertension: Secondary | ICD-10-CM | POA: Diagnosis not present

## 2016-11-24 DIAGNOSIS — E785 Hyperlipidemia, unspecified: Secondary | ICD-10-CM | POA: Diagnosis not present

## 2016-12-16 DIAGNOSIS — K146 Glossodynia: Secondary | ICD-10-CM | POA: Diagnosis not present

## 2016-12-20 ENCOUNTER — Ambulatory Visit (INDEPENDENT_AMBULATORY_CARE_PROVIDER_SITE_OTHER): Payer: 59 | Admitting: Orthopaedic Surgery

## 2016-12-21 ENCOUNTER — Ambulatory Visit (INDEPENDENT_AMBULATORY_CARE_PROVIDER_SITE_OTHER): Payer: 59

## 2016-12-21 ENCOUNTER — Ambulatory Visit (INDEPENDENT_AMBULATORY_CARE_PROVIDER_SITE_OTHER): Payer: 59 | Admitting: Orthopaedic Surgery

## 2016-12-21 ENCOUNTER — Encounter (INDEPENDENT_AMBULATORY_CARE_PROVIDER_SITE_OTHER): Payer: Self-pay | Admitting: Orthopaedic Surgery

## 2016-12-21 VITALS — BP 124/84 | HR 76 | Resp 14 | Ht 64.0 in | Wt 219.0 lb

## 2016-12-21 DIAGNOSIS — M25522 Pain in left elbow: Secondary | ICD-10-CM

## 2016-12-21 NOTE — Progress Notes (Signed)
   Office Visit Note   Patient: Ann Foley           Date of Birth: [birth year]           MRN: [id number] Visit Date: 12/21/2016              Requested by: Jonathon Jordan, MD [address] [place], [place] [address] PCP: Ann Coma, MD   Assessment & Plan: Visit Diagnoses: Medial epicondylitis left elbow   Plan: Local cortisone injection, monitored response and then see back as needed  Follow-Up Instructions: No Follow-up on file.   Orders:  No orders of the defined types were placed in this encounter.  No orders of the defined types were placed in this encounter.     Procedures: No procedures performed   Clinical Data: No additional findings.   Subjective: No chief complaint on file.   Ann Foley presents today with left elbow pain, denies injury, numbness or radiation of pain. Pain started x 1 month.. Pt is Right handed hx of left CTR surgery 10 years ago.  [name] has a history of exploration of the medial epicondyle right elbow years ago excellent result.  Review of Systems   Objective: Vital Signs: There were no vitals taken for this visit.  Physical Exam  Ortho Exam left elbow with full flexion and extension. Full pronation and supination. Localized tenderness over the medial epicondyle. No pain over the ulnar nerve. No Tinel over the ulnar nerve. Neurovascular exam intact distally. Good grip good release. Skin intact.  Specialty Comments:  No specialty comments available.  Imaging: No results found.   PMFS History: Patient Active Problem List   Diagnosis Date Noted  . Vesicles 05/07/2015  . Contraceptive management 05/07/2015  . Vaginal discharge 11/29/2013  . BV (bacterial vaginosis) 11/29/2013   Past Medical History:  Diagnosis Date  . Allergy   . Anxiety   . Asthma   . BV (bacterial vaginosis) 11/29/2013  . Contraceptive management 05/07/2015  . Hypertension   . Plantar fasciitis   . Thyroid disease   .  Vaginal discharge 11/29/2013  . Vesicles 05/07/2015    Family History  Problem Relation Age of Onset  . Hypertension Mother   . Hypertension Father   . Hypertension Sister   . Asthma Paternal Aunt   . Heart disease Paternal Grandmother   . Heart disease Paternal Grandfather     Past Surgical History:  Procedure Laterality Date  . CARPAL TUNNEL RELEASE    . ELBOW ARTHROSCOPY Right   . SHOULDER ARTHROSCOPY Right   . THYROIDECTOMY    . WISDOM TOOTH EXTRACTION     Social History   Occupational History  . Not on file.   Social History Main Topics  . Smoking status: Never Smoker  . Smokeless tobacco: Never Used  . Alcohol use No  . Drug use: No  . Sexual activity: No

## 2016-12-27 ENCOUNTER — Ambulatory Visit (HOSPITAL_COMMUNITY): Payer: 59

## 2016-12-27 DIAGNOSIS — Z1211 Encounter for screening for malignant neoplasm of colon: Secondary | ICD-10-CM | POA: Diagnosis not present

## 2017-01-03 ENCOUNTER — Ambulatory Visit (HOSPITAL_COMMUNITY)
Admission: RE | Admit: 2017-01-03 | Discharge: 2017-01-03 | Disposition: A | Discharge status: Home / Self Care | Payer: 59 | Source: Ambulatory Visit | Attending: Obstetrics and Gynecology | Admitting: Obstetrics and Gynecology

## 2017-01-03 DIAGNOSIS — Z1231 Encounter for screening mammogram for malignant neoplasm of breast: Secondary | ICD-10-CM | POA: Insufficient documentation

## 2017-02-20 ENCOUNTER — Other Ambulatory Visit: Payer: Self-pay | Admitting: Adult Health

## 2017-03-04 ENCOUNTER — Encounter (INDEPENDENT_AMBULATORY_CARE_PROVIDER_SITE_OTHER): Payer: Self-pay | Admitting: Orthopaedic Surgery

## 2017-03-04 ENCOUNTER — Ambulatory Visit (INDEPENDENT_AMBULATORY_CARE_PROVIDER_SITE_OTHER): Payer: 59 | Admitting: Orthopaedic Surgery

## 2017-03-04 VITALS — BP 122/81 | HR 74 | Ht 64.0 in | Wt 190.0 lb

## 2017-03-04 DIAGNOSIS — M25522 Pain in left elbow: Secondary | ICD-10-CM | POA: Diagnosis not present

## 2017-03-04 MED ORDER — BUPIVACAINE HCL 0.5 % IJ SOLN
1.0000 mL | INTRAMUSCULAR | Status: AC | PRN
  Administered 2017-03-04: 1 mL

## 2017-03-04 MED ORDER — METHYLPREDNISOLONE ACETATE 40 MG/ML IJ SUSP
40.0000 mg | INTRAMUSCULAR | Status: AC | PRN
  Administered 2017-03-04: 40 mg

## 2017-03-04 MED ORDER — LIDOCAINE HCL 1 % IJ SOLN
1.0000 mL | INTRAMUSCULAR | Status: AC | PRN
  Administered 2017-03-04: 1 mL

## 2017-03-04 NOTE — Progress Notes (Signed)
Office Visit Note   Patient: Ann Foley           Date of Birth: 10-28-1965           MRN: [id number] Visit Date: 03/04/2017              Requested by: Jonathon Jordan, MD [address] [place],  [address] PCP: Jonathon Jordan, MD   Assessment & Plan: Visit Diagnoses:  1. Pain in left elbow    Medial epicondylitis left elbow. Long discussion regarding different treatment options. 2 months ago a similar injection made a difference. We'll repeat that today. Etc. MRI scan if this does not "last"  Follow-Up Instructions: Return if symptoms worsen or fail to improve.   Orders:  No orders of the defined types were placed in this encounter.  No orders of the defined types were placed in this encounter.     Procedures: Hand/UE Inj Date/Time: 03/04/2017 10:54 AM Performed by: Garald Balding Authorized by: Garald Balding   Consent Given by:  Patient Indications:  Pain Condition: medial epicondylitis   Site:  L elbow Needle Size:  27 G Medications:  1 mL lidocaine 1 %; 40 mg methylPREDNISolone acetate 40 MG/ML; 1 mL bupivacaine 0.5 %     Clinical Data: No additional findings.   Subjective: Chief Complaint  Patient presents with  . Left Elbow - Pain, Edema     Ann Foley presents today with left elbow pain, denies injury, numbness or radiation of pain. She responded well with the cortisone injection on 12/21/16 that lasted until about 2 weeks ago. No numbness today.  No history of recent injury or trauma. Pain localized along the medial epicondyles as it did several months ago. No numbness or tingling or ulnar nerve symptoms.  HPI  Review of Systems   Objective: Vital Signs: BP 122/81   Pulse 74   Ht 5\' 4"  (1.626 m)   Wt 190 lb (86.2 kg)   BMI 32.61 kg/m   Physical Exam  Ortho Exam left elbow with localized tenderness over the medial epicondyle. Full range of motion in flexion extension pronation supination. No Tinel's  or pain over the ulnar nerve. Her vascular exam is intact. Skin intact  Specialty Comments:  No specialty comments available.  Imaging: No results found.   PMFS History: Patient Active Problem List   Diagnosis Date Noted  . Vesicles 05/07/2015  . Contraceptive management 05/07/2015  . Vaginal discharge 11/29/2013  . BV (bacterial vaginosis) 11/29/2013   Past Medical History:  Diagnosis Date  . Allergy   . Anxiety   . Asthma   . BV (bacterial vaginosis) 11/29/2013  . Contraceptive management 05/07/2015  . Hypertension   . Plantar fasciitis   . Thyroid disease   . Vaginal discharge 11/29/2013  . Vesicles 05/07/2015    Family History  Problem Relation Age of Onset  . Hypertension Mother   . Hypertension Father   . Hypertension Sister   . Asthma Paternal Aunt   . Heart disease Paternal Grandmother   . Heart disease Paternal Grandfather     Past Surgical History:  Procedure Laterality Date  . CARPAL TUNNEL RELEASE    . ELBOW ARTHROSCOPY Right   . SHOULDER ARTHROSCOPY Right   . THYROIDECTOMY    . WISDOM TOOTH EXTRACTION     Social History   Occupational History  . Not on file.   Social History Main Topics  . Smoking status: Never Smoker  .  Smokeless tobacco: Never Used  . Alcohol use No  . Drug use: No  . Sexual activity: No     Garald Balding, MD   Note - This record has been created using [employer].  Chart creation errors have been sought, but may not always  have been located. Such creation errors do not reflect on  the standard of medical care.

## 2017-04-19 DIAGNOSIS — Z0000 Encounter for general adult medical examination without abnormal findings: Secondary | ICD-10-CM | POA: Diagnosis not present

## 2017-04-19 DIAGNOSIS — Z79899 Other long term (current) drug therapy: Secondary | ICD-10-CM | POA: Diagnosis not present

## 2017-04-19 DIAGNOSIS — E890 Postprocedural hypothyroidism: Secondary | ICD-10-CM | POA: Diagnosis not present

## 2017-04-19 DIAGNOSIS — Z1159 Encounter for screening for other viral diseases: Secondary | ICD-10-CM | POA: Diagnosis not present

## 2017-05-23 DIAGNOSIS — M79674 Pain in right toe(s): Secondary | ICD-10-CM | POA: Diagnosis not present

## 2017-05-23 DIAGNOSIS — G5761 Lesion of plantar nerve, right lower limb: Secondary | ICD-10-CM | POA: Diagnosis not present

## 2017-08-24 DIAGNOSIS — E890 Postprocedural hypothyroidism: Secondary | ICD-10-CM | POA: Diagnosis not present

## 2017-09-06 DIAGNOSIS — Z23 Encounter for immunization: Secondary | ICD-10-CM | POA: Diagnosis not present

## 2017-10-03 DIAGNOSIS — E890 Postprocedural hypothyroidism: Secondary | ICD-10-CM | POA: Diagnosis not present

## 2017-12-01 DIAGNOSIS — E890 Postprocedural hypothyroidism: Secondary | ICD-10-CM | POA: Diagnosis not present

## 2017-12-08 DIAGNOSIS — H6523 Chronic serous otitis media, bilateral: Secondary | ICD-10-CM | POA: Diagnosis not present

## 2017-12-08 DIAGNOSIS — H900 Conductive hearing loss, bilateral: Secondary | ICD-10-CM | POA: Diagnosis not present

## 2017-12-27 DIAGNOSIS — M26629 Arthralgia of temporomandibular joint, unspecified side: Secondary | ICD-10-CM | POA: Diagnosis not present

## 2018-01-02 ENCOUNTER — Other Ambulatory Visit: Payer: Self-pay | Admitting: Obstetrics and Gynecology

## 2018-01-02 DIAGNOSIS — Z1231 Encounter for screening mammogram for malignant neoplasm of breast: Secondary | ICD-10-CM

## 2018-01-05 ENCOUNTER — Encounter (HOSPITAL_COMMUNITY): Payer: Self-pay | Admitting: Radiology

## 2018-01-05 ENCOUNTER — Ambulatory Visit (HOSPITAL_COMMUNITY)
Admission: RE | Admit: 2018-01-05 | Discharge: 2018-01-05 | Disposition: A | Discharge status: Home / Self Care | Payer: 59 | Source: Ambulatory Visit | Attending: Obstetrics and Gynecology | Admitting: Obstetrics and Gynecology

## 2018-01-05 DIAGNOSIS — Z1231 Encounter for screening mammogram for malignant neoplasm of breast: Secondary | ICD-10-CM | POA: Diagnosis not present

## 2018-01-17 ENCOUNTER — Ambulatory Visit: Payer: 59 | Admitting: Podiatry

## 2018-01-17 ENCOUNTER — Encounter: Payer: Self-pay | Admitting: Podiatry

## 2018-01-17 ENCOUNTER — Ambulatory Visit (INDEPENDENT_AMBULATORY_CARE_PROVIDER_SITE_OTHER): Payer: 59

## 2018-01-17 DIAGNOSIS — M19072 Primary osteoarthritis, left ankle and foot: Secondary | ICD-10-CM | POA: Diagnosis not present

## 2018-01-17 DIAGNOSIS — Q66229 Congenital metatarsus adductus, unspecified foot: Secondary | ICD-10-CM

## 2018-01-17 DIAGNOSIS — Q6622 Congenital metatarsus adductus: Secondary | ICD-10-CM | POA: Diagnosis not present

## 2018-01-17 DIAGNOSIS — M21619 Bunion of unspecified foot: Secondary | ICD-10-CM

## 2018-01-17 DIAGNOSIS — M779 Enthesopathy, unspecified: Secondary | ICD-10-CM | POA: Diagnosis not present

## 2018-01-17 MED ORDER — MELOXICAM 15 MG PO TABS: 15.0000 mg | ORAL_TABLET | Freq: Every day | ORAL | 0 refills | Status: DC

## 2018-01-17 NOTE — Progress Notes (Signed)
Subjective:   Patient ID: Ann Foley, female   DOB: 52 y.o.   MRN: [id number]   HPI 52 year old female presents the office today for concerns of pain to her left bunion which is been ongoing for about 2 months.  She states the area hurts mostly with shoes.  States he gets a red mostly from pressure wearing shoes.   She states the pain starts on the bunion she also gets pain in the proper foot as well.  She denies any recent injury or trauma to the area she states the pain started all around the same time.  Has had no recent treatment she has no other concerns.  She denies any numbness or tingling.   Review of Systems  All other systems reviewed and are negative.  Past Medical History:  Diagnosis Date  . Allergy   . Anxiety   . Asthma   . BV (bacterial vaginosis) 11/29/2013  . Contraceptive management 05/07/2015  . Hypertension   . Plantar fasciitis   . Thyroid disease   . Vaginal discharge 11/29/2013  . Vesicles 05/07/2015    Past Surgical History:  Procedure Laterality Date  . CARPAL TUNNEL RELEASE    . ELBOW ARTHROSCOPY Right   . SHOULDER ARTHROSCOPY Right   . THYROIDECTOMY    . WISDOM TOOTH EXTRACTION       Current Outpatient Medications:  .  ADVAIR DISKUS 250-50 MCG/DOSE AEPB, Inhale 1 puff into the lungs 2 (two) times daily. , Disp: , Rfl:  .  Ascorbic Acid (VITAMIN C) 1000 MG tablet, Take 1,000 mg by mouth daily., Disp: , Rfl:  .  buPROPion (WELLBUTRIN SR) 150 MG 12 hr tablet, Take 150 mg by mouth 2 (two) times daily. , Disp: , Rfl:  .  escitalopram (LEXAPRO) 20 MG tablet, Take 20 mg by mouth daily. , Disp: , Rfl:  .  fluconazole (DIFLUCAN) 150 MG tablet, Take 1 tablet (150 mg total) by mouth once. (Patient not taking: Reported on 12/21/2016), Disp: 1 tablet, Rfl: 1 .  levocetirizine (XYZAL) 5 MG tablet, Take 5 mg by mouth every evening. , Disp: , Rfl:  .  losartan-hydrochlorothiazide (HYZAAR) 50-12.5 MG per tablet, Take 1 tablet by mouth daily. , Disp: , Rfl:  .   meloxicam (MOBIC) 15 MG tablet, Take 1 tablet (15 mg total) by mouth daily., Disp: 30 tablet, Rfl: 0 .  metroNIDAZOLE (FLAGYL) 500 MG tablet, Take 1 tablet (500 mg total) by mouth 2 (two) times daily. (Patient not taking: Reported on 05/07/2015), Disp: 14 tablet, Rfl: 0 .  omeprazole (PRILOSEC) 20 MG capsule, , Disp: , Rfl:  .  PROAIR HFA 108 (90 BASE) MCG/ACT inhaler, Inhale 1 puff into the lungs as needed. , Disp: , Rfl:  .  ranitidine (ZANTAC) 150 MG tablet, Take 150 mg by mouth at bedtime., Disp: , Rfl: 12 .  SYNTHROID 175 MCG tablet, Take 175 mcg by mouth daily before breakfast. , Disp: , Rfl:  .  TRI-LEGEST FE 1-20/1-30/1-35 MG-MCG tablet, TAKE 1 TABLET BY MOUTH DAILY, Disp: 84 tablet, Rfl: 3 .  valACYclovir (VALTREX) 1000 MG tablet, Take 1 tablet (1,000 mg total) by mouth 2 (two) times daily. (Patient not taking: Reported on 12/21/2016), Disp: 20 tablet, Rfl: 3 .  zolpidem (AMBIEN) 10 MG tablet, Take 10 mg by mouth at bedtime as needed. , Disp: , Rfl:   No Known Allergies  Social History   Socioeconomic History  . Marital status: Divorced    Spouse name: Not on  file  . Number of children: Not on file  . Years of education: Not on file  . Highest education level: Not on file  Occupational History  . Not on file  Social Needs  . Financial resource strain: Not on file  . Food insecurity:    Worry: Not on file    Inability: Not on file  . Transportation needs:    Medical: Not on file    Non-medical: Not on file  Tobacco Use  . Smoking status: Never Smoker  . Smokeless tobacco: Never Used  Substance and Sexual Activity  . Alcohol use: No  . Drug use: No  . Sexual activity: Never    Birth control/protection: Pill  Lifestyle  . Physical activity:    Days per week: Not on file    Minutes per session: Not on file  . Stress: Not on file  Relationships  . Social connections:    Talks on phone: Not on file    Gets together: Not on file    Attends religious service: Not on file     Active member of club or organization: Not on file    Attends meetings of clubs or organizations: Not on file    Relationship status: Not on file  . Intimate partner violence:    Fear of current or ex partner: Not on file    Emotionally abused: Not on file    Physically abused: Not on file    Forced sexual activity: Not on file  Other Topics Concern  . Not on file  Social History Narrative  . Not on file        Objective:  Physical Exam  General: AAO x3, NAD  Dermatological: There is mild erythema present on the medial aspect of the first metatarsal head of the left foot on the bunion but there is no increased warmth or open sores.  No open lesions or pre-ulcerative lesions.  Vascular: Dorsalis Pedis artery and Posterior Tibial artery pedal pulses are 2/4 bilateral with immedate capillary fill time. Pedal hair growth present. No varicosities and no lower extremity edema present bilateral. There is no pain with calf compression, swelling, warmth, erythema.   Neruologic: Grossly intact via light touch bilateral.. Protective threshold with Semmes Wienstein monofilament intact to all pedal sites bilateral.  Musculoskeletal: There is a decrease in medial arch upon weightbearing.  Moderate HAV is present.  There is tenderness palpation of the inside of there is also tenderness over the first metatarsal cuneiform joint as well as the second third metatarsal cuneiform joints.  There is no area pinpoint bony tenderness or pain to vibratory sensation.  There is no other areas of edema, erythema, increase in warmth muscular strength 5/5 in all groups tested bilateral.  Gait: Unassisted, Nonantalgic.      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  A moderate HAV is present.  Met adductus is present.  Changes present to the midfoot. -We discussed with conservative as well as surgical treatment options.  At this point she wishes to hold off on surgical intervention.  I prescribed meloxicam discussed side affects.  Hold off on steroid injection today for multiple reasons.  We will check orthotic coverage and will follow up with this.  Change in shoes as  well.  Follow-up if symptoms continue or sooner if needed.  Trula Slade DPM

## 2018-01-17 NOTE — Patient Instructions (Signed)

## 2018-01-19 ENCOUNTER — Telehealth: Payer: Self-pay | Admitting: Podiatry

## 2018-01-19 NOTE — Telephone Encounter (Signed)
Called pt with benefit information and she is going to hold off for now since she has not met deductible she would be responsible for 398.00

## 2018-02-03 DIAGNOSIS — E039 Hypothyroidism, unspecified: Secondary | ICD-10-CM | POA: Diagnosis not present

## 2018-02-13 ENCOUNTER — Other Ambulatory Visit: Payer: Self-pay | Admitting: Podiatry

## 2018-02-23 DIAGNOSIS — J45901 Unspecified asthma with (acute) exacerbation: Secondary | ICD-10-CM | POA: Diagnosis not present

## 2018-03-14 ENCOUNTER — Other Ambulatory Visit: Payer: Self-pay | Admitting: Podiatry

## 2018-04-16 ENCOUNTER — Other Ambulatory Visit: Payer: Self-pay | Admitting: Podiatry

## 2018-06-06 DIAGNOSIS — Z0000 Encounter for general adult medical examination without abnormal findings: Secondary | ICD-10-CM | POA: Diagnosis not present

## 2018-06-06 DIAGNOSIS — E890 Postprocedural hypothyroidism: Secondary | ICD-10-CM | POA: Diagnosis not present

## 2018-06-06 DIAGNOSIS — E559 Vitamin D deficiency, unspecified: Secondary | ICD-10-CM | POA: Diagnosis not present

## 2018-06-06 DIAGNOSIS — Z79899 Other long term (current) drug therapy: Secondary | ICD-10-CM | POA: Diagnosis not present

## 2018-06-06 DIAGNOSIS — I10 Essential (primary) hypertension: Secondary | ICD-10-CM | POA: Diagnosis not present

## 2018-06-06 DIAGNOSIS — Z1211 Encounter for screening for malignant neoplasm of colon: Secondary | ICD-10-CM | POA: Diagnosis not present

## 2018-06-10 ENCOUNTER — Other Ambulatory Visit: Payer: Self-pay | Admitting: Podiatry

## 2018-07-19 ENCOUNTER — Other Ambulatory Visit: Payer: Self-pay

## 2018-07-19 ENCOUNTER — Other Ambulatory Visit (HOSPITAL_COMMUNITY)
Admission: RE | Admit: 2018-07-19 | Discharge: 2018-07-19 | Disposition: A | Discharge status: Home / Self Care | Payer: 59 | Source: Ambulatory Visit | Attending: Adult Health | Admitting: Adult Health

## 2018-07-19 ENCOUNTER — Ambulatory Visit (INDEPENDENT_AMBULATORY_CARE_PROVIDER_SITE_OTHER): Payer: 59 | Admitting: Adult Health

## 2018-07-19 ENCOUNTER — Encounter: Payer: Self-pay | Admitting: Adult Health

## 2018-07-19 VITALS — BP 115/74 | HR 80 | Ht 64.5 in | Wt 202.0 lb

## 2018-07-19 DIAGNOSIS — Z1211 Encounter for screening for malignant neoplasm of colon: Secondary | ICD-10-CM | POA: Diagnosis not present

## 2018-07-19 DIAGNOSIS — Z01419 Encounter for gynecological examination (general) (routine) without abnormal findings: Secondary | ICD-10-CM

## 2018-07-19 DIAGNOSIS — R61 Generalized hyperhidrosis: Secondary | ICD-10-CM | POA: Diagnosis not present

## 2018-07-19 DIAGNOSIS — R4589 Other symptoms and signs involving emotional state: Secondary | ICD-10-CM | POA: Diagnosis not present

## 2018-07-19 DIAGNOSIS — Z1212 Encounter for screening for malignant neoplasm of rectum: Secondary | ICD-10-CM | POA: Diagnosis not present

## 2018-07-19 DIAGNOSIS — N951 Menopausal and female climacteric states: Secondary | ICD-10-CM

## 2018-07-19 DIAGNOSIS — Z8619 Personal history of other infectious and parasitic diseases: Secondary | ICD-10-CM

## 2018-07-19 LAB — HEMOCCULT GUIAC POC 1CARD (OFFICE): Fecal Occult Blood, POC: NEGATIVE

## 2018-07-19 MED ORDER — ESTRADIOL-LEVONORGESTREL 0.045-0.015 MG/DAY TD PTWK: 1.0000 | MEDICATED_PATCH | TRANSDERMAL | 12 refills | Status: DC

## 2018-07-19 NOTE — Patient Instructions (Signed)

## 2018-07-19 NOTE — Progress Notes (Signed)
Patient ID: Ann Foley, female   DOB: 03-Jan-1966, 52 y.o.   MRN: [id number] History of Present Illness:  Ann Foley is a 52 year old white female, single, PM in for a well woman gyn exam and pap.She had labs with PCP recently.Her last pap was in 2016 and showed +HPV.  PCP is Jonathon Jordan.   Current Medications, Allergies, Past Medical History, Past Surgical History, Family History and Social History were reviewed in [employer] record.     Review of Systems: Patient denies any headaches, hearing loss, fatigue, blurred vision, shortness of breath, chest pain, abdominal pain, problems with bowel movements, urination, or intercourse(not having sex). No joint pain or mood swings. +hot flashes and night sweats, moody, and no period in over a year. She says water runs off her at work, she is at [employer] and PT at [employer].    Physical Exam:BP 115/74 (BP Location: Right Arm, Patient Position: Sitting, Cuff Size: Normal)   Pulse 80   Ht 5' 4.5" (1.638 m)   Wt 202 lb (91.6 kg)   LMP 05/25/2017 (Approximate)   BMI 34.14 kg/m  General:  Well developed, well nourished, no acute distress Skin:  Warm and dry Neck:  Midline trachea, normal thyroid, good ROM, no lymphadenopathy Lungs; Clear to auscultation bilaterally Breast:  No dominant palpable mass, retraction, or nipple discharge Cardiovascular: Regular rate and rhythm Abdomen:  Soft, non tender, no hepatosplenomegaly Pelvic:  External genitalia is normal in appearance, no lesions.  The vagina is normal in appearance. Urethra has no lesions or masses. The cervix is smooth, pap with HPV performed. Uterus is felt to be normal size, shape, and contour.  No adnexal masses or tenderness noted.Bladder is non tender, no masses felt. Rectal: Good sphincter tone, no polyps, or hemorrhoids felt.  Hemoccult negative. Extremities/musculoskeletal:  No swelling or varicosities noted, no clubbing or cyanosis Psych:  No mood changes, alert and  cooperative,seems happy PHQ 2 score 0. Examination chaperoned by Levy Pupa LPN. Discussed HRT and she wants to try, will rx patch.   Impression: 1. Encounter for gynecological examination with Papanicolaou smear of cervix   2. Screening for colorectal cancer   3. Hot flashes due to menopause   4. Night sweats   5. Moody   6. History of HPV infection       Plan: Meds ordered this encounter  Medications  . estradiol-levonorgestrel (CLIMARAPRO) 0.045-0.015 MG/DAY    Sig: Place 1 patch onto the skin once a week.    Dispense:  4 patch    Refill:  12    Order Specific Question:   Supervising Provider    Answer:   Tania Ade H [[id number]]  F/U in 3 months Physical in 1 year Pap in 3 if normal Labs with PCP Mammogram yearly   Review handout on menopause and HRT

## 2018-07-21 LAB — CYTOLOGY - PAP
Diagnosis: NEGATIVE
HPV: NOT DETECTED

## 2018-10-19 ENCOUNTER — Ambulatory Visit: Payer: 59 | Admitting: Adult Health

## 2018-10-19 ENCOUNTER — Encounter: Payer: Self-pay | Admitting: Adult Health

## 2018-10-19 VITALS — BP 111/75 | HR 89 | Ht 64.5 in | Wt 210.0 lb

## 2018-10-19 DIAGNOSIS — Z79890 Hormone replacement therapy (postmenopausal): Secondary | ICD-10-CM

## 2018-10-19 NOTE — Progress Notes (Signed)
Patient ID: KESA BIRKY, female   DOB: 1966-02-13, 53 y.o.   MRN: [id number] History of Present Illness: Wynell is a 53 year old white female, divorced, back in follow after starting climara pro patch for hot flashes, night sweats and being moody and is much better.  She is still working 2 jobs.  PCP is Dr Stephanie Acre.    Current Medications, Allergies, Past Medical History, Past Surgical History, Family History and Social History were reviewed in [employer] record.     Review of Systems: Hot flashes much better Night sweats better Still a little moody at times   Physical Exam:BP 111/75 (BP Location: Left Arm, Patient Position: Sitting, Cuff Size: Normal)   Pulse 89   Ht 5' 4.5" (1.638 m)   Wt 210 lb (95.3 kg)   LMP 05/25/2017 (Approximate)   BMI 35.49 kg/m  General:  Well developed, well nourished, no acute distress Skin:  Warm and dry Lungs; Clear to auscultation bilaterally Cardiovascular: Regular rate and rhythm Psych:  No mood changes, alert and cooperative,seems happy PHQ  2 score 0, is on meds. Fall risk is low. Will continue climara pro patch.  Impression: HRT    Plan: Continue patch, has refills Return in October for physical

## 2019-02-08 DIAGNOSIS — I10 Essential (primary) hypertension: Secondary | ICD-10-CM | POA: Diagnosis not present

## 2019-02-08 DIAGNOSIS — J4530 Mild persistent asthma, uncomplicated: Secondary | ICD-10-CM | POA: Diagnosis not present

## 2019-03-06 ENCOUNTER — Other Ambulatory Visit (HOSPITAL_COMMUNITY): Payer: Self-pay | Admitting: Obstetrics and Gynecology

## 2019-03-06 DIAGNOSIS — Z1231 Encounter for screening mammogram for malignant neoplasm of breast: Secondary | ICD-10-CM

## 2019-03-07 ENCOUNTER — Ambulatory Visit (HOSPITAL_COMMUNITY)
Admission: RE | Admit: 2019-03-07 | Discharge: 2019-03-07 | Disposition: A | Discharge status: Home / Self Care | Payer: 59 | Source: Ambulatory Visit | Attending: Obstetrics and Gynecology | Admitting: Obstetrics and Gynecology

## 2019-03-07 ENCOUNTER — Other Ambulatory Visit: Payer: Self-pay

## 2019-03-07 DIAGNOSIS — Z1231 Encounter for screening mammogram for malignant neoplasm of breast: Secondary | ICD-10-CM | POA: Insufficient documentation

## 2019-03-27 ENCOUNTER — Other Ambulatory Visit: Payer: Self-pay

## 2019-03-27 ENCOUNTER — Ambulatory Visit: Payer: 59 | Admitting: Orthopaedic Surgery

## 2019-03-27 ENCOUNTER — Encounter: Payer: Self-pay | Admitting: Orthopaedic Surgery

## 2019-03-27 ENCOUNTER — Ambulatory Visit: Payer: Self-pay

## 2019-03-27 VITALS — BP 138/99 | HR 79 | Ht 66.5 in | Wt 205.0 lb

## 2019-03-27 DIAGNOSIS — G5601 Carpal tunnel syndrome, right upper limb: Secondary | ICD-10-CM | POA: Insufficient documentation

## 2019-03-27 DIAGNOSIS — M25831 Other specified joint disorders, right wrist: Secondary | ICD-10-CM | POA: Diagnosis not present

## 2019-03-27 DIAGNOSIS — M25531 Pain in right wrist: Secondary | ICD-10-CM

## 2019-03-27 NOTE — Progress Notes (Signed)
Office Visit Note   Patient: Ann Foley           Date of Birth: 1966-05-18           MRN: [id number] Visit Date: 03/27/2019              Requested by: Jonathon Jordan, MD [address] [place],  [place] [address] PCP: Jonathon Jordan, MD   Assessment & Plan: Visit Diagnoses:  1. 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 Carpal tunnel syndrome, right upper limb   3. Pain in right wrist     Plan:  #1: EMGs of right hand to rule out carpal tunnel syndrome #2: ulnar carpal steroid injection #3: Voltaren gel to the wrist and hand #4: Return after EMGs  Follow-Up Instructions: Return in about 3 weeks (around 04/17/2019).   Orders:  Orders Placed This Encounter  Procedures  . XR Wrist Complete Right   No orders of the defined types were placed in this encounter.     Procedures: No procedures performed   Clinical Data: No additional findings.   Subjective: Chief Complaint  Patient presents with  . Right Wrist - Pain   HPI Patient presents today with right wrist pain X 6 weeks. No known injury. Her pain is located ulnarly and has gradually worsened. The pain occurs more with movement and use.  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  No swelling. She takes Advil as needed. She is right hand dominant. No previous surgery.  At nighttime she does have numbness and tingling into the hand and fingers.  She tends to sleep with the wrist volar flexed.  Mainly in the median nerve distribution.  She tends to sleep with the wrist volar flexed. She is also complaining of some intermittent swelling.  Worsens with her work.  Review of Systems  Constitutional: Negative for fatigue.  HENT: Negative for ear pain.   Eyes: Negative for pain.  Respiratory: Negative for shortness of breath.   Cardiovascular: Negative for leg swelling.  Gastrointestinal: Negative for constipation and diarrhea.  Endocrine: Negative for cold intolerance and heat  intolerance.  Genitourinary: Negative for difficulty urinating.  Musculoskeletal: Negative for joint swelling.  Skin: Negative for rash.  Allergic/Immunologic: Positive for food allergies.  Neurological: Negative for weakness.  Hematological: Does not bruise/bleed easily.  Psychiatric/Behavioral: Negative for sleep disturbance.     Objective: Vital Signs: BP (!) 138/99   Pulse 79   Ht 5' 6.5" (1.689 m)   Wt 205 lb (93 kg)   LMP 05/25/2017 (Approximate)   BMI 32.59 kg/m   Physical Exam Constitutional:      Appearance: She is well-developed.  Eyes:     Pupils: Pupils are equal, round, and reactive to light.  Pulmonary:     Effort: Pulmonary effort is normal.  Skin:    General: Skin is warm and dry.  Neurological:     Mental Status: She is alert and oriented to person, place, and time.  Psychiatric:        Behavior: Behavior normal.     Ortho Exam  Examination of the right wrist reveals tenderness to palpation over the ulnar joint carpus.  She has about 30 degrees of dorsi and palmar flexion.  She does hurt when a ulnarly deviate her hand.  No pain radially of the wrist.  Fullness in the area of the radial aspect of the wrist.  No warmth or erythema.  Neurovascular intact  Specialty Comments:  No specialty comments available.  Imaging: Xr Wrist Complete Right  Result Date: 03/27/2019 4 view x-ray of the right wrist reveals cystic changes diffusely about the wrist.  She has a large 1 in the scaphoid.  Subluxation of the first [facility] joint.  Flattening of the still portion of the scaphoid.    PMFS History: Current Outpatient Medications  Medication Sig Dispense Refill  . ADVAIR DISKUS 250-50 MCG/DOSE AEPB Inhale 1 puff into the lungs 2 (two) times daily.     . Ascorbic Acid (VITAMIN C) 1000 MG tablet Take 1,000 mg by mouth daily.    Marland Kitchen buPROPion (WELLBUTRIN SR) 150 MG 12 hr tablet Take 150 mg by mouth 2 (two) times daily.     Marland Kitchen escitalopram (LEXAPRO) 20 MG tablet Take 20  mg by mouth daily.     Marland Kitchen estradiol-levonorgestrel (CLIMARAPRO) 0.045-0.015 MG/DAY Place 1 patch onto the skin once a week. 4 patch 12  . levocetirizine (XYZAL) 5 MG tablet Take 5 mg by mouth every evening.     Marland Kitchen losartan-hydrochlorothiazide (HYZAAR) 50-12.5 MG per tablet Take 1 tablet by mouth daily.     Marland Kitchen omeprazole (PRILOSEC) 20 MG capsule     . PROAIR HFA 108 (90 BASE) MCG/ACT inhaler Inhale 1 puff into the lungs as needed.     Marland Kitchen SYNTHROID 175 MCG tablet Take 175 mcg by mouth daily before breakfast.     . zolpidem (AMBIEN) 10 MG tablet Take 10 mg by mouth at bedtime as needed.      No current facility-administered medications for this visit.     Patient Active Problem List   Diagnosis Date Noted  . 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 Carpal tunnel syndrome, right upper limb 03/27/2019  . Moody 07/19/2018  . Night sweats 07/19/2018  . Hot flashes due to menopause 07/19/2018  . Screening for colorectal cancer 07/19/2018  . Encounter for gynecological examination with Papanicolaou smear of cervix 07/19/2018  . History of HPV infection 07/19/2018  . Vesicles 05/07/2015  . Contraceptive management 05/07/2015  . Vaginal discharge 11/29/2013  . BV (bacterial vaginosis) 11/29/2013   Past Medical History:  Diagnosis Date  . Allergy   . Anxiety   . Asthma   . BV (bacterial vaginosis) 11/29/2013  . Contraceptive management 05/07/2015  . Hypertension   . Plantar fasciitis   . Thyroid disease   . Vaginal discharge 11/29/2013  . Vesicles 05/07/2015    Family History  Problem Relation Age of Onset  . Hypertension Mother   . Hypertension Father   . Hypertension Sister   . Asthma Paternal Aunt   . Heart disease Paternal Grandmother   . Heart disease Paternal Grandfather     Past Surgical History:  Procedure Laterality Date  . CARPAL TUNNEL RELEASE    . ELBOW ARTHROSCOPY Right   . SHOULDER ARTHROSCOPY Right   . THYROIDECTOMY    . WISDOM TOOTH  EXTRACTION     Social History   Occupational History  . Not on file  Tobacco Use  . Smoking status: Never Smoker  . Smokeless tobacco: Never Used  Substance and Sexual Activity  . Alcohol use: No  . Drug use: No  . Sexual activity: Not Currently    Birth control/protection: Post-menopausal

## 2019-03-27 NOTE — Addendum Note (Signed)
Addended by: Lendon Collar on: 03/27/2019 10:30 AM   Modules accepted: Orders

## 2019-04-18 ENCOUNTER — Ambulatory Visit (INDEPENDENT_AMBULATORY_CARE_PROVIDER_SITE_OTHER): Payer: 59 | Admitting: Physical Medicine and Rehabilitation

## 2019-04-18 ENCOUNTER — Encounter: Payer: Self-pay | Admitting: Physical Medicine and Rehabilitation

## 2019-04-18 ENCOUNTER — Other Ambulatory Visit: Payer: Self-pay

## 2019-04-18 ENCOUNTER — Ambulatory Visit: Payer: 59 | Admitting: Orthopaedic Surgery

## 2019-04-18 DIAGNOSIS — R202 Paresthesia of skin: Secondary | ICD-10-CM

## 2019-04-18 NOTE — Progress Notes (Signed)
 .  Numeric Pain Rating Scale and Functional Assessment Average Pain 0   In the last MONTH (on 0-10 scale) has pain interfered with the following?  1. General activity like being  able to carry out your everyday physical activities such as walking, climbing stairs, carrying groceries, or moving a chair?  Rating(7)    

## 2019-04-19 NOTE — Progress Notes (Signed)
Ann Foley - 53 y.o. female MRN [id number]  Date of birth: 11-22-1965  Office Visit Note: Visit Date: 04/18/2019 PCP: Jonathon Jordan, MD Referred by: Jonathon Jordan, MD  Subjective: Chief Complaint  Patient presents with  . Right Hand - Numbness, Tingling   HPI:  Ann Foley is a 53 y.o. female who comes in today At the request of Dr. Joni Fears for electrodiagnostic study of the right upper limb.  Patient is right-hand dominant with several years of progressively worsening pain numbness and tingling in the right hand particularly the thumb index and middle finger.  She denies any numbness or tingling in the fifth digit.  She denies any frank radicular symptoms down the arms and no left-sided complaints.  She has had no history of trauma or prior carpal tunnel surgery.  She is not had any electrodiagnostic studies.  She has been wearing braces for years without any relief.  At this point medications are not very helpful.  She gets worsening with driving and typing and nocturnal complaints with positive flick sign.  ROS Otherwise per HPI.  Assessment & Plan: Visit Diagnoses:  1. Paresthesia of skin     Plan: Impression: The above electrodiagnostic study is ABNORMAL and reveals evidence of a moderate right median nerve entrapment at the wrist (carpal tunnel syndrome) affecting sensory and motor components.   There is no significant electrodiagnostic evidence of any other focal nerve entrapment, brachial plexopathy or cervical radiculopathy  Recommendations: 1.  Follow-up with referring physician. 2.  Continue current management of symptoms. 3.  Continue use of resting splint at night-time and as needed during the day. 4.  Suggest surgical evaluation.  Meds & Orders: No orders of the defined types were placed in this encounter.   Orders Placed This Encounter  Procedures  . NCV with EMG (electromyography)    Follow-up: Return for Joni Fears, MD.    Procedures: No procedures performed  EMG & NCV Findings: Evaluation of the right median motor nerve showed prolonged distal onset latency (4.3 ms) and decreased conduction velocity (Elbow-Wrist, 47 m/s).  The right median (across palm) sensory nerve showed prolonged distal peak latency (Wrist, 4.1 ms).  All remaining nerves (as indicated in the following tables) were within normal limits.    All examined muscles (as indicated in the following table) showed no evidence of electrical instability.    Impression: The above electrodiagnostic study is ABNORMAL and reveals evidence of a moderate right median nerve entrapment at the wrist (carpal tunnel syndrome) affecting sensory and motor components.   There is no significant electrodiagnostic evidence of any other focal nerve entrapment, brachial plexopathy or cervical radiculopathy  Recommendations: 1.  Follow-up with referring physician. 2.  Continue current management of symptoms. 3.  Continue use of resting splint at night-time and as needed during the day. 4.  Suggest surgical evaluation.  ___________________________ Laurence Spates FAAPMR Board Certified, American Board of Physical Medicine and Rehabilitation    Nerve Conduction Studies Anti Sensory Summary Table   Stim Site NR Peak (ms) Norm Peak (ms) P-T Amp (V) Norm P-T Amp Site1 Site2 Delta-P (ms) Dist (cm) Vel (m/s) Norm Vel (m/s)  Right Median Acr Palm Anti Sensory (2nd Digit)  32.7C  Wrist    *4.1 <3.6 23.3 >10 Wrist Palm 2.2 0.0    Palm    1.9 <2.0 22.4         Right Radial Anti Sensory (Base 1st Digit)  32.1C  Wrist    2.2 <3.1  18.1  Wrist Base 1st Digit 2.2 0.0    Right Ulnar Anti Sensory (5th Digit)  32.7C  Wrist    3.1 <3.7 22.5 >15.0 Wrist 5th Digit 3.1 14.0 45 >38   Motor Summary Table   Stim Site NR Onset (ms) Norm Onset (ms) O-P Amp (mV) Norm O-P Amp Site1 Site2 Delta-0 (ms) Dist (cm) Vel (m/s) Norm Vel (m/s)  Right Median Motor (Abd Poll Brev)  32.2C  Wrist     *4.3 <4.2 7.4 >5 Elbow Wrist 4.5 21.0 *47 >50  Elbow    8.8  6.9         Right Ulnar Motor (Abd Dig Min)  32.3C  Wrist    2.8 <4.2 9.2 >3 B Elbow Wrist 3.4 19.0 56 >53  B Elbow    6.2  8.7  A Elbow B Elbow 1.2 10.0 83 >53  A Elbow    7.4  8.6          EMG   Side Muscle Nerve Root Ins Act Fibs Psw Amp Dur Poly Recrt Int Fraser Din Comment  Right Abd Poll Brev Median C8-T1 Nml Nml Nml Nml Nml 0 Nml Nml   Right 1stDorInt Ulnar C8-T1 Nml Nml Nml Nml Nml 0 Nml Nml   Right PronatorTeres Median C6-7 Nml Nml Nml Nml Nml 0 Nml Nml   Right Biceps Musculocut C5-6 Nml Nml Nml Nml Nml 0 Nml Nml   Right Deltoid Axillary C5-6 Nml Nml Nml Nml Nml 0 Nml Nml     Nerve Conduction Studies Anti Sensory Left/Right Comparison   Stim Site L Lat (ms) R Lat (ms) L-R Lat (ms) L Amp (V) R Amp (V) L-R Amp (%) Site1 Site2 L Vel (m/s) R Vel (m/s) L-R Vel (m/s)  Median Acr Palm Anti Sensory (2nd Digit)  32.7C  Wrist  *4.1   23.3  Wrist Palm     Palm  1.9   22.4        Radial Anti Sensory (Base 1st Digit)  32.1C  Wrist  2.2   18.1  Wrist Base 1st Digit     Ulnar Anti Sensory (5th Digit)  32.7C  Wrist  3.1   22.5  Wrist 5th Digit  45    Motor Left/Right Comparison   Stim Site L Lat (ms) R Lat (ms) L-R Lat (ms) L Amp (mV) R Amp (mV) L-R Amp (%) Site1 Site2 L Vel (m/s) R Vel (m/s) L-R Vel (m/s)  Median Motor (Abd Poll Brev)  32.2C  Wrist  *4.3   7.4  Elbow Wrist  *47   Elbow  8.8   6.9        Ulnar Motor (Abd Dig Min)  32.3C  Wrist  2.8   9.2  B Elbow Wrist  56   B Elbow  6.2   8.7  A Elbow B Elbow  83   A Elbow  7.4   8.6           Waveforms:            Clinical History: No specialty comments available.     Objective:  VS:  HT:    WT:   BMI:     BP:   HR: bpm  TEMP: ( )  RESP:  Physical Exam Musculoskeletal:        General: No swelling, tenderness or deformity.     Comments: Inspection reveals no atrophy of the bilateral APB or FDI or hand intrinsics. There is no swelling, color  changes, allodynia or dystrophic changes. There is 5 out of 5 strength in the bilateral wrist extension, finger abduction and long finger flexion. There is intact sensation to light touch in all dermatomal and peripheral nerve distributions. There is a positive Phalen's test on the right. There is a negative Hoffmann's test bilaterally.  Skin:    General: Skin is warm and dry.     Findings: No erythema or rash.  Neurological:     General: No focal deficit present.     Mental Status: She is alert and oriented to person, place, and time.     Motor: No weakness or abnormal muscle tone.     Coordination: Coordination normal.  Psychiatric:        Mood and Affect: Mood normal.        Behavior: Behavior normal.     Ortho Exam Imaging: No results found.

## 2019-04-19 NOTE — Procedures (Signed)
EMG & NCV Findings: Evaluation of the right median motor nerve showed prolonged distal onset latency (4.3 ms) and decreased conduction velocity (Elbow-Wrist, 47 m/s).  The right median (across palm) sensory nerve showed prolonged distal peak latency (Wrist, 4.1 ms).  All remaining nerves (as indicated in the following tables) were within normal limits.    All examined muscles (as indicated in the following table) showed no evidence of electrical instability.    Impression: The above electrodiagnostic study is ABNORMAL and reveals evidence of a moderate right median nerve entrapment at the wrist (carpal tunnel syndrome) affecting sensory and motor components.   There is no significant electrodiagnostic evidence of any other focal nerve entrapment, brachial plexopathy or cervical radiculopathy  Recommendations: 1.  Follow-up with referring physician. 2.  Continue current management of symptoms. 3.  Continue use of resting splint at night-time and as needed during the day. 4.  Suggest surgical evaluation.  ___________________________ Laurence Spates FAAPMR Board Certified, American Board of Physical Medicine and Rehabilitation    Nerve Conduction Studies Anti Sensory Summary Table   Stim Site NR Peak (ms) Norm Peak (ms) P-T Amp (V) Norm P-T Amp Site1 Site2 Delta-P (ms) Dist (cm) Vel (m/s) Norm Vel (m/s)  Right Median Acr Palm Anti Sensory (2nd Digit)  32.7C  Wrist    *4.1 <3.6 23.3 >10 Wrist Palm 2.2 0.0    Palm    1.9 <2.0 22.4         Right Radial Anti Sensory (Base 1st Digit)  32.1C  Wrist    2.2 <3.1 18.1  Wrist Base 1st Digit 2.2 0.0    Right Ulnar Anti Sensory (5th Digit)  32.7C  Wrist    3.1 <3.7 22.5 >15.0 Wrist 5th Digit 3.1 14.0 45 >38   Motor Summary Table   Stim Site NR Onset (ms) Norm Onset (ms) O-P Amp (mV) Norm O-P Amp Site1 Site2 Delta-0 (ms) Dist (cm) Vel (m/s) Norm Vel (m/s)  Right Median Motor (Abd Poll Brev)  32.2C  Wrist    *4.3 <4.2 7.4 >5 Elbow Wrist 4.5  21.0 *47 >50  Elbow    8.8  6.9         Right Ulnar Motor (Abd Dig Min)  32.3C  Wrist    2.8 <4.2 9.2 >3 B Elbow Wrist 3.4 19.0 56 >53  B Elbow    6.2  8.7  A Elbow B Elbow 1.2 10.0 83 >53  A Elbow    7.4  8.6          EMG   Side Muscle Nerve Root Ins Act Fibs Psw Amp Dur Poly Recrt Int Fraser Din Comment  Right Abd Poll Brev Median C8-T1 Nml Nml Nml Nml Nml 0 Nml Nml   Right 1stDorInt Ulnar C8-T1 Nml Nml Nml Nml Nml 0 Nml Nml   Right PronatorTeres Median C6-7 Nml Nml Nml Nml Nml 0 Nml Nml   Right Biceps Musculocut C5-6 Nml Nml Nml Nml Nml 0 Nml Nml   Right Deltoid Axillary C5-6 Nml Nml Nml Nml Nml 0 Nml Nml     Nerve Conduction Studies Anti Sensory Left/Right Comparison   Stim Site L Lat (ms) R Lat (ms) L-R Lat (ms) L Amp (V) R Amp (V) L-R Amp (%) Site1 Site2 L Vel (m/s) R Vel (m/s) L-R Vel (m/s)  Median Acr Palm Anti Sensory (2nd Digit)  32.7C  Wrist  *4.1   23.3  Wrist Palm     Palm  1.9   22.4  Radial Anti Sensory (Base 1st Digit)  32.1C  Wrist  2.2   18.1  Wrist Base 1st Digit     Ulnar Anti Sensory (5th Digit)  32.7C  Wrist  3.1   22.5  Wrist 5th Digit  45    Motor Left/Right Comparison   Stim Site L Lat (ms) R Lat (ms) L-R Lat (ms) L Amp (mV) R Amp (mV) L-R Amp (%) Site1 Site2 L Vel (m/s) R Vel (m/s) L-R Vel (m/s)  Median Motor (Abd Poll Brev)  32.2C  Wrist  *4.3   7.4  Elbow Wrist  *47   Elbow  8.8   6.9        Ulnar Motor (Abd Dig Min)  32.3C  Wrist  2.8   9.2  B Elbow Wrist  56   B Elbow  6.2   8.7  A Elbow B Elbow  83   A Elbow  7.4   8.6           Waveforms:

## 2019-04-23 ENCOUNTER — Other Ambulatory Visit: Payer: Self-pay

## 2019-04-23 DIAGNOSIS — Z20822 Contact with and (suspected) exposure to covid-19: Secondary | ICD-10-CM

## 2019-04-24 ENCOUNTER — Encounter: Payer: Self-pay | Admitting: Orthopaedic Surgery

## 2019-04-24 ENCOUNTER — Ambulatory Visit: Payer: 59 | Admitting: Orthopaedic Surgery

## 2019-04-24 ENCOUNTER — Other Ambulatory Visit: Payer: Self-pay

## 2019-04-24 VITALS — BP 137/91 | HR 82 | Ht 66.5 in | Wt 205.0 lb

## 2019-04-24 DIAGNOSIS — G5601 Carpal tunnel syndrome, right upper limb: Secondary | ICD-10-CM | POA: Diagnosis not present

## 2019-04-24 NOTE — Progress Notes (Signed)
Office Visit Note   Patient: Ann Foley           Date of Birth: 1966/07/09           MRN: [id number] Visit Date: 04/24/2019              Requested by: Jonathon Jordan, MD [address] [place],  [place] [address] PCP: Jonathon Jordan, MD   Assessment & Plan: Visit Diagnoses:  1. Carpal tunnel syndrome, right upper limb     Plan: EMGs and nerve conduction studies demonstrate moderate right carpal tunnel syndrome.  Long discussion regarding the diagnosis and treatment options including surgical decompression.  Irina would like to wait.  She is taking over-the-counter medicine and wearing the splint at night and as needed.  She is given a copy of report.  Follow-Up Instructions: Return if symptoms worsen or fail to improve.   Orders:  No orders of the defined types were placed in this encounter.  No orders of the defined types were placed in this encounter.     Procedures: No procedures performed   Clinical Data: No additional findings.   Subjective: Chief Complaint  Patient presents with  . Right Wrist - Follow-up  Patient presents today for a follow up on her right wrist. She had an EMG on 04/18/19 with Dr.Newton. She is here today to go over those results.   HPI  Review of Systems  Constitutional: Negative for fatigue.  HENT: Negative for ear pain.   Eyes: Negative for pain.  Respiratory: Negative for shortness of breath.   Cardiovascular: Negative for leg swelling.  Gastrointestinal: Negative for constipation and diarrhea.  Endocrine: Negative for cold intolerance and heat intolerance.  Genitourinary: Negative for difficulty urinating.  Musculoskeletal: Negative for joint swelling.  Skin: Negative for rash.  Allergic/Immunologic: Positive for food allergies.  Neurological: Negative for weakness.  Hematological: Does not bruise/bleed easily.  Psychiatric/Behavioral: Negative for sleep disturbance.     Objective: Vital Signs: BP (!)  137/91   Pulse 82   Ht 5' 6.5" (1.689 m)   Wt 205 lb (93 kg)   LMP 05/25/2017 (Approximate)   BMI 32.59 kg/m   Physical Exam  Ortho Exam awake alert and oriented x3.  Comfortable sitting.  Minimally positive Tinel's over the median nerve the right wrist.  Good strength with opposition of thumb to little finger.  Full range of motion of fingers.  No swelling.  Specialty Comments:  No specialty comments available.  Imaging: No results found.   PMFS History: Patient Active Problem List   Diagnosis Date Noted  . 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 Carpal tunnel syndrome, right upper limb 03/27/2019  . Moody 07/19/2018  . Night sweats 07/19/2018  . Hot flashes due to menopause 07/19/2018  . Screening for colorectal cancer 07/19/2018  . Encounter for gynecological examination with Papanicolaou smear of cervix 07/19/2018  . History of HPV infection 07/19/2018  . Vesicles 05/07/2015  . Contraceptive management 05/07/2015  . Vaginal discharge 11/29/2013  . BV (bacterial vaginosis) 11/29/2013   Past Medical History:  Diagnosis Date  . Allergy   . Anxiety   . Asthma   . BV (bacterial vaginosis) 11/29/2013  . Contraceptive management 05/07/2015  . Hypertension   . Plantar fasciitis   . Thyroid disease   . Vaginal discharge 11/29/2013  . Vesicles 05/07/2015    Family History  Problem Relation Age of Onset  . Hypertension Mother   . Hypertension  Father   . Hypertension Sister   . Asthma Paternal Aunt   . Heart disease Paternal Grandmother   . Heart disease Paternal Grandfather     Past Surgical History:  Procedure Laterality Date  . CARPAL TUNNEL RELEASE    . ELBOW ARTHROSCOPY Right   . SHOULDER ARTHROSCOPY Right   . THYROIDECTOMY    . WISDOM TOOTH EXTRACTION     Social History   Occupational History  . Not on file  Tobacco Use  . Smoking status: Never Smoker  . Smokeless tobacco: Never Used  Substance and Sexual Activity  . Alcohol  use: No  . Drug use: No  . Sexual activity: Not Currently    Birth control/protection: Post-menopausal

## 2019-04-27 LAB — NOVEL CORONAVIRUS, NAA: SARS-CoV-2, NAA: NOT DETECTED

## 2019-09-27 ENCOUNTER — Ambulatory Visit: Payer: Self-pay

## 2019-09-27 ENCOUNTER — Ambulatory Visit: Payer: 59 | Admitting: Orthopaedic Surgery

## 2019-09-27 ENCOUNTER — Encounter: Payer: Self-pay | Admitting: Orthopaedic Surgery

## 2019-09-27 ENCOUNTER — Other Ambulatory Visit: Payer: Self-pay

## 2019-09-27 VITALS — Ht 66.5 in | Wt 190.0 lb

## 2019-09-27 DIAGNOSIS — M79644 Pain in right finger(s): Secondary | ICD-10-CM | POA: Diagnosis not present

## 2019-09-27 DIAGNOSIS — M1811 Unilateral primary osteoarthritis of first carpometacarpal joint, right hand: Secondary | ICD-10-CM | POA: Diagnosis not present

## 2019-09-27 MED ORDER — LIDOCAINE HCL 1 % IJ SOLN
0.3000 mL | INTRAMUSCULAR | Status: AC | PRN
  Administered 2019-09-27: .3 mL

## 2019-09-27 MED ORDER — BUPIVACAINE HCL 0.25 % IJ SOLN
0.3300 mL | INTRAMUSCULAR | Status: AC | PRN
Start: 2019-09-27 — End: 2019-09-27
  Administered 2019-09-27: 12:00:00 .33 mL

## 2019-09-27 MED ORDER — METHYLPREDNISOLONE ACETATE 40 MG/ML IJ SUSP
13.3300 mg | INTRAMUSCULAR | Status: AC | PRN
  Administered 2019-09-27: 13.33 mg

## 2019-09-27 NOTE — Progress Notes (Signed)
Office Visit Note   Patient: Ann Foley           Date of Birth: Jul 01, 1966           MRN: [id number] Visit Date: 09/27/2019              Requested by: Jonathon Jordan, MD [address] [place],  [place] [address] PCP: Jonathon Jordan, MD   Assessment & Plan: Visit Diagnoses:  1. Pain of right thumb   2. Arthritis of carpometacarpal (CMC) joint of right thumb     Plan: #1: Corticosteroid injection to the right first [facility] joint by Dr. Durward Fortes #2: Soft CMC wrap was given to her  Follow-Up Instructions: Return if symptoms worsen or fail to improve.   Orders:  Orders Placed This Encounter  Procedures  . XR Finger Thumb Right  . XR Hand Complete Right   No orders of the defined types were placed in this encounter.     Procedures: Hand/UE Inj: R thumb CMC for osteoarthritis on 09/27/2019 11:56 AM Indications: pain Details: 25 G needle, radial approach Medications: 0.3 mL lidocaine 1 %; 0.33 mL bupivacaine 0.25 %; 13.33 mg methylPREDNISolone acetate 40 MG/ML Procedure, treatment alternatives, risks and benefits explained, specific risks discussed. Consent was given by the patient. Immediately prior to procedure a time out was called to verify the correct patient, procedure, equipment, support staff and site/side marked as required. Patient was prepped and draped in the usual sterile fashion.       Clinical Data: No additional findings.   Subjective: Chief Complaint  Patient presents with  . Right Hand - Pain  Patient presents today for right hand pain. She is having pain at her first MCP and CMC joint. 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 She is taking Tylenol or Advil as needed. No numbness or tingling. No swelling. She was last evaluated on 04/24/2019 for right carpal tunnel syndrome.  HPI  Review of Systems  Constitutional: Negative for fatigue.  HENT: Negative for ear pain.   Eyes: Negative for pain.    Respiratory: Negative for shortness of breath.   Cardiovascular: Negative for leg swelling.  Gastrointestinal: Negative for constipation and diarrhea.  Endocrine: Negative for cold intolerance and heat intolerance.  Genitourinary: Negative for difficulty urinating.  Musculoskeletal: Negative for joint swelling.  Skin: Negative for rash.  Allergic/Immunologic: Positive for food allergies.  Neurological: Negative for weakness.  Hematological: Does not bruise/bleed easily.  Psychiatric/Behavioral: Negative for sleep disturbance.     Objective: Vital Signs: Ht 5' 6.5" (1.689 m)   Wt 190 lb (86.2 kg)   LMP 05/25/2017 (Approximate)   BMI 30.21 kg/m   Physical Exam Constitutional:      Appearance: She is well-developed.  Eyes:     Pupils: Pupils are equal, round, and reactive to light.  Pulmonary:     Effort: Pulmonary effort is normal.  Skin:    General: Skin is warm and dry.  Neurological:     Mental Status: She is alert and oriented to person, place, and time.  Psychiatric:        Behavior: Behavior normal.     Ortho Exam  Pain to palpation over the first [facility] joint.  Positive grind test.  Some tenderness to palpation over the MCP.  Good motion though.  Specialty Comments:  No specialty comments available.  Imaging: XR Finger Thumb Right  Result Date: 09/27/2019 Three-view x-ray of the right hand  reveals degenerative changes noted at the first [place] joint. Joint subluxation and periarticular spurring. There are some changes cystic in the MCP more at the metacarpal head.   XR Hand Complete Right  Result Date: 09/27/2019 Three-view x-ray of the right hand reveals degenerative changes noted at the first [facility] joint.  Joint subluxation and periarticular spurring.  There are some changes cystic in the MCP more at the metacarpal head.    PMFS History: Current Outpatient Medications  Medication Sig Dispense Refill  . ADVAIR DISKUS 250-50 MCG/DOSE AEPB Inhale 1 puff into  the lungs 2 (two) times daily.     . Ascorbic Acid (VITAMIN C) 1000 MG tablet Take 1,000 mg by mouth daily.    Marland Kitchen buPROPion (WELLBUTRIN SR) 150 MG 12 hr tablet Take 150 mg by mouth 2 (two) times daily.     Marland Kitchen escitalopram (LEXAPRO) 20 MG tablet Take 20 mg by mouth daily.     Marland Kitchen estradiol-levonorgestrel (CLIMARAPRO) 0.045-0.015 MG/DAY Place 1 patch onto the skin once a week. 4 patch 12  . levocetirizine (XYZAL) 5 MG tablet Take 5 mg by mouth every evening.     Marland Kitchen losartan-hydrochlorothiazide (HYZAAR) 50-12.5 MG per tablet Take 1 tablet by mouth daily.     Marland Kitchen omeprazole (PRILOSEC) 20 MG capsule     . PROAIR HFA 108 (90 BASE) MCG/ACT inhaler Inhale 1 puff into the lungs as needed.     Marland Kitchen SYNTHROID 175 MCG tablet Take 175 mcg by mouth daily before breakfast.     . zolpidem (AMBIEN) 10 MG tablet Take 10 mg by mouth at bedtime as needed.      No current facility-administered medications for this visit.    Patient Active Problem List   Diagnosis Date Noted  . Arthritis of carpometacarpal [facility]) joint of right thumb 09/27/2019  . 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 Carpal tunnel syndrome, right upper limb 03/27/2019  . Moody 07/19/2018  . Night sweats 07/19/2018  . Hot flashes due to menopause 07/19/2018  . Screening for colorectal cancer 07/19/2018  . Encounter for gynecological examination with Papanicolaou smear of cervix 07/19/2018  . History of HPV infection 07/19/2018  . Vesicles 05/07/2015  . Contraceptive management 05/07/2015  . Vaginal discharge 11/29/2013  . BV (bacterial vaginosis) 11/29/2013   Past Medical History:  Diagnosis Date  . Allergy   . Anxiety   . Asthma   . BV (bacterial vaginosis) 11/29/2013  . Contraceptive management 05/07/2015  . Hypertension   . Plantar fasciitis   . Thyroid disease   . Vaginal discharge 11/29/2013  . Vesicles 05/07/2015    Family History  Problem Relation Age of Onset  . Hypertension Mother   . Hypertension  Father   . Hypertension Sister   . Asthma Paternal Aunt   . Heart disease Paternal Grandmother   . Heart disease Paternal Grandfather     Past Surgical History:  Procedure Laterality Date  . CARPAL TUNNEL RELEASE    . ELBOW ARTHROSCOPY Right   . SHOULDER ARTHROSCOPY Right   . THYROIDECTOMY    . WISDOM TOOTH EXTRACTION     Social History   Occupational History  . Not on file  Tobacco Use  . Smoking status: Never Smoker  . Smokeless tobacco: Never Used  Substance and Sexual Activity  . Alcohol use: No  . Drug use: No  . Sexual activity: Not Currently    Birth control/protection: Post-menopausal

## 2019-11-14 ENCOUNTER — Encounter: Payer: Self-pay | Admitting: Orthopaedic Surgery

## 2019-11-14 ENCOUNTER — Other Ambulatory Visit: Payer: Self-pay

## 2019-11-14 ENCOUNTER — Ambulatory Visit: Payer: 59 | Admitting: Orthopaedic Surgery

## 2019-11-14 VITALS — Ht 66.5 in | Wt 206.0 lb

## 2019-11-14 DIAGNOSIS — M79644 Pain in right finger(s): Secondary | ICD-10-CM | POA: Diagnosis not present

## 2019-11-14 DIAGNOSIS — M1811 Unilateral primary osteoarthritis of first carpometacarpal joint, right hand: Secondary | ICD-10-CM | POA: Diagnosis not present

## 2019-11-14 NOTE — Progress Notes (Signed)
Office Visit Note   Patient: Ann Foley           Date of Birth: 1966-08-23           MRN: [id number] Visit Date: 11/14/2019              Requested by: Jonathon Jordan, MD [address] [place],  [place] [address] PCP: Jonathon Jordan, MD   Assessment & Plan: Visit Diagnoses:  1. Pain of right thumb   2. Arthritis of carpometacarpal (CMC) joint of right thumb     Plan: Done only received about 2 weeks of pain relief after cortisone injection at the [facility] joint of her right thumb.  She is really interested in a definitive procedure.  I have discussed about the arthroplasty and will refer her to Dr.Xu.  She does have the Freedom splint  Follow-Up Instructions: Return if symptoms worsen or fail to improve, for Will refer to Dr.Xu for consideration of basal thumb surgery.   Orders:  Orders Placed This Encounter  Procedures  . Ambulatory referral to Orthopedic Surgery   No orders of the defined types were placed in this encounter.     Procedures: No procedures performed   Clinical Data: No additional findings.   Subjective: Chief Complaint  Patient presents with  . Right Hand - Pain  Patient presents today for a follow up on her recurrent right thumb pain. She was here in December of 2020 and received a cortisone injection at her 1st [facility] joint. She states that the injection helped for about 2 weeks. She is taking Advil or tylenol as needed.  No related numbness or tingling.  Pain seems to be fairly well localized to the base of the thumb at the [facility] joint  HPI  Review of Systems   Objective: Vital Signs: Ht 5' 6.5" (1.689 m)   Wt 206 lb (93.4 kg)   LMP 05/25/2017 (Approximate)   BMI 32.75 kg/m   Physical Exam Constitutional:      Appearance: She is well-developed.  Eyes:     Pupils: Pupils are equal, round, and reactive to light.  Pulmonary:     Effort: Pulmonary effort is normal.  Skin:    General: Skin is warm and dry.  Neurological:      Mental Status: She is alert and oriented to person, place, and time.  Psychiatric:        Behavior: Behavior normal.     Ortho Exam awake alert and oriented x3.  Comfortable sitting.  Pain at the base of the right thumb carpometacarpal joint with minimally positive grind test.  Partial subluxation.  Exam really is unchanged.  No pain at the metacarpal phalangeal joint.  Note swelling of the finger.  No triggering.  Neurologically intact  Specialty Comments:  No specialty comments available.  Imaging: No results found.   PMFS History: Patient Active Problem List   Diagnosis Date Noted  . Arthritis of carpometacarpal [facility]) joint of right thumb 09/27/2019  . 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 Carpal tunnel syndrome, right upper limb 03/27/2019  . Moody 07/19/2018  . Night sweats 07/19/2018  . Hot flashes due to menopause 07/19/2018  . Screening for colorectal cancer 07/19/2018  . Encounter for gynecological examination with Papanicolaou smear of cervix 07/19/2018  . History of HPV infection 07/19/2018  . Vesicles 05/07/2015  . Contraceptive management 05/07/2015  . Vaginal discharge 11/29/2013  . BV (bacterial vaginosis) 11/29/2013   Past Medical  History:  Diagnosis Date  . Allergy   . Anxiety   . Asthma   . BV (bacterial vaginosis) 11/29/2013  . Contraceptive management 05/07/2015  . Hypertension   . Plantar fasciitis   . Thyroid disease   . Vaginal discharge 11/29/2013  . Vesicles 05/07/2015    Family History  Problem Relation Age of Onset  . Hypertension Mother   . Hypertension Father   . Hypertension Sister   . Asthma Paternal Aunt   . Heart disease Paternal Grandmother   . Heart disease Paternal Grandfather     Past Surgical History:  Procedure Laterality Date  . CARPAL TUNNEL RELEASE    . ELBOW ARTHROSCOPY Right   . SHOULDER ARTHROSCOPY Right   . THYROIDECTOMY    . WISDOM TOOTH EXTRACTION     Social History    Occupational History  . Not on file  Tobacco Use  . Smoking status: Never Smoker  . Smokeless tobacco: Never Used  Substance and Sexual Activity  . Alcohol use: No  . Drug use: No  . Sexual activity: Not Currently    Birth control/protection: Post-menopausal

## 2019-11-21 ENCOUNTER — Other Ambulatory Visit: Payer: Self-pay

## 2019-11-21 ENCOUNTER — Encounter: Payer: Self-pay | Admitting: Orthopaedic Surgery

## 2019-11-21 ENCOUNTER — Ambulatory Visit: Payer: 59 | Admitting: Orthopaedic Surgery

## 2019-11-21 DIAGNOSIS — G5601 Carpal tunnel syndrome, right upper limb: Secondary | ICD-10-CM | POA: Insufficient documentation

## 2019-11-21 DIAGNOSIS — M1811 Unilateral primary osteoarthritis of first carpometacarpal joint, right hand: Secondary | ICD-10-CM | POA: Diagnosis not present

## 2019-11-21 NOTE — Progress Notes (Signed)
Office Visit Note   Patient: Ann Foley           Date of Birth: March 14, 1966           MRN: [id number] Visit Date: 11/21/2019              Requested by: Garald Balding, MD [address] [place],  [place] [address] PCP: Jonathon Jordan, MD   Assessment & Plan: Visit Diagnoses:  1. Right carpal tunnel syndrome   2. Arthritis of carpometacarpal (CMC) joint of right thumb     Plan: Impression is end-stage right thumb basal joint arthritis and right moderate carpal tunnel syndrome.  We had a lengthy discussion today reviewing the x-rays and her nerve conduction studies and based on our review of nonsurgical versus surgical treatments and their associated risks and benefits Ann Foley has elected to proceed with right thumb CMC arthroplasty as well as right carpal tunnel release.  Rehab and recovery reviewed with the patient.  Questions encouraged and answered.  We will schedule the patient for the near future.  Follow-Up Instructions: Return for 2 week postop visit.   Orders:  No orders of the defined types were placed in this encounter.  No orders of the defined types were placed in this encounter.     Procedures: No procedures performed   Clinical Data: No additional findings.   Subjective: Chief Complaint  Patient presents with   Right Thumb - Pain    Ann Foley is a very pleasant 54 year old female who is referral from Dr. Durward Fortes for evaluation of right thumb basal joint arthritis as well as right carpal tunnel syndrome.  I am turning the thumb she has had significant trouble with this for several months.  She does a lot of typing and date of injury and she has significant pain in her thenar musculature.  She has trouble pinching and opening bottles and grasping due to the pain.  She had a cortisone injection in December's of 2020 which helped for about 2 weeks.  She feels a lot of popping in the base of the thumb.  Advil helps mildly.  She also had nerve conduction  studies in July 2020 which showed moderate right carpal tunnel syndrome.  She wears a carpal tunnel brace at night.  She has tried thumb spica braces to help with the thumb but unfortunately this has not given her much relief.   Review of Systems  Constitutional: Negative.   HENT: Negative.   Eyes: Negative.   Respiratory: Negative.   Cardiovascular: Negative.   Endocrine: Negative.   Musculoskeletal: Negative.   Neurological: Negative.   Hematological: Negative.   Psychiatric/Behavioral: Negative.   All other systems reviewed and are negative.    Objective: Vital Signs: LMP 05/25/2017 (Approximate)   Physical Exam Vitals and nursing note reviewed.  Constitutional:      Appearance: She is well-developed.  HENT:     Head: Normocephalic and atraumatic.  Pulmonary:     Effort: Pulmonary effort is normal.  Abdominal:     Palpations: Abdomen is soft.  Musculoskeletal:     Cervical back: Neck supple.  Skin:    General: Skin is warm.     Capillary Refill: Capillary refill takes less than 2 seconds.  Neurological:     Mental Status: She is alert and oriented to person, place, and time.  Psychiatric:        Behavior: Behavior normal.        Thought Content: Thought content normal.  Judgment: Judgment normal.     Ortho Exam Right thumb and hand exam shows positive grind test.  Positive Durkin's and positive Phalen's and positive Tinel.  No muscle atrophy.  Slightly decreased sensation in the median nerve distribution.  There is no reproducible triggering of the thumb.  MP joint mobility is normal.  Specialty Comments:  No specialty comments available.  Imaging: No results found.   PMFS History: Patient Active Problem List   Diagnosis Date Noted   Right carpal tunnel syndrome 11/21/2019   Arthritis of carpometacarpal (CMC) joint of right thumb 09/27/2019   Ulnar impingement syndrome of right upper extremity 03/27/2019   Carpal tunnel syndrome, right upper  limb 03/27/2019   Moody 07/19/2018   Night sweats 07/19/2018   Hot flashes due to menopause 07/19/2018   Screening for colorectal cancer 07/19/2018   Encounter for gynecological examination with Papanicolaou smear of cervix 07/19/2018   History of HPV infection 07/19/2018   Vesicles 05/07/2015   Contraceptive management 05/07/2015   Vaginal discharge 11/29/2013   BV (bacterial vaginosis) 11/29/2013   Past Medical History:  Diagnosis Date   Allergy    Anxiety    Asthma    BV (bacterial vaginosis) 11/29/2013   Contraceptive management 05/07/2015   Hypertension    Plantar fasciitis    Thyroid disease    Vaginal discharge 11/29/2013   Vesicles 05/07/2015    Family History  Problem Relation Age of Onset   Hypertension Mother    Hypertension Father    Hypertension Sister    Asthma Paternal Aunt    Heart disease Paternal Grandmother    Heart disease Paternal Grandfather     Past Surgical History:  Procedure Laterality Date   CARPAL TUNNEL RELEASE     ELBOW ARTHROSCOPY Right    SHOULDER ARTHROSCOPY Right    THYROIDECTOMY     WISDOM TOOTH EXTRACTION     Social History   Occupational History   Not on file  Tobacco Use   Smoking status: Never Smoker   Smokeless tobacco: Never Used  Substance and Sexual Activity   Alcohol use: No   Drug use: No   Sexual activity: Not Currently    Birth control/protection: Post-menopausal

## 2019-12-19 ENCOUNTER — Other Ambulatory Visit: Payer: Self-pay | Admitting: Physician Assistant

## 2019-12-19 MED ORDER — OXYCODONE-ACETAMINOPHEN 5-325 MG PO TABS: 1.0000 | ORAL_TABLET | Freq: Four times a day (QID) | ORAL | 0 refills | Status: DC | PRN

## 2019-12-19 MED ORDER — ONDANSETRON HCL 4 MG PO TABS: 4.0000 mg | ORAL_TABLET | Freq: Three times a day (TID) | ORAL | 0 refills | Status: DC | PRN

## 2019-12-20 DIAGNOSIS — M1811 Unilateral primary osteoarthritis of first carpometacarpal joint, right hand: Secondary | ICD-10-CM

## 2019-12-20 DIAGNOSIS — G5601 Carpal tunnel syndrome, right upper limb: Secondary | ICD-10-CM

## 2019-12-21 ENCOUNTER — Telehealth: Payer: Self-pay | Admitting: Orthopaedic Surgery

## 2019-12-21 ENCOUNTER — Telehealth: Payer: Self-pay | Admitting: Radiology

## 2019-12-21 MED ORDER — KETOROLAC TROMETHAMINE 10 MG PO TABS: 10.0000 mg | ORAL_TABLET | Freq: Two times a day (BID) | ORAL | 0 refills | Status: DC | PRN

## 2019-12-21 MED ORDER — OXYCODONE HCL ER 10 MG PO T12A
10.0000 mg | EXTENDED_RELEASE_TABLET | Freq: Two times a day (BID) | ORAL | 0 refills | Status: AC
Start: 2019-12-21 — End: 2019-12-24

## 2019-12-21 NOTE — Telephone Encounter (Signed)
Pharmacy called stating manufacturer of toradol makes them fill out documentation in regards to whether patient has had toradol before. Per chart and Dr. Erlinda Hong, not to our knowledge. Pharmacy needs to document Dr. Erlinda Hong is aware side effect of toradol is bleeding prior to filling rx. Dr. Erlinda Hong aware. Pharmacy to fill.

## 2019-12-21 NOTE — Telephone Encounter (Signed)
Patient called stating that she had surgery yesterday and the pain mediation that Dr. Erlinda Hong prescribed for her is not working.  She is in serious pain.  CB#[phone].  Thank you.

## 2019-12-21 NOTE — Telephone Encounter (Signed)
Just sent in some.  Can you let her know please.  Thanks.

## 2019-12-21 NOTE — Telephone Encounter (Signed)
Called patient to advise  °

## 2020-01-03 ENCOUNTER — Encounter: Payer: Self-pay | Admitting: Orthopaedic Surgery

## 2020-01-03 ENCOUNTER — Ambulatory Visit (INDEPENDENT_AMBULATORY_CARE_PROVIDER_SITE_OTHER): Payer: 59 | Admitting: Orthopaedic Surgery

## 2020-01-03 ENCOUNTER — Ambulatory Visit (INDEPENDENT_AMBULATORY_CARE_PROVIDER_SITE_OTHER): Payer: 59

## 2020-01-03 ENCOUNTER — Other Ambulatory Visit: Payer: Self-pay

## 2020-01-03 DIAGNOSIS — M1811 Unilateral primary osteoarthritis of first carpometacarpal joint, right hand: Secondary | ICD-10-CM

## 2020-01-03 DIAGNOSIS — Z9889 Other specified postprocedural states: Secondary | ICD-10-CM

## 2020-01-03 DIAGNOSIS — G5601 Carpal tunnel syndrome, right upper limb: Secondary | ICD-10-CM

## 2020-01-03 NOTE — Progress Notes (Signed)
Post-Op Visit Note   Patient: Ann Foley           Date of Birth: 05-31-1966           MRN: [id number] Visit Date: 01/03/2020 PCP: Jonathon Jordan, MD   Assessment & Plan:  Chief Complaint:  Chief Complaint  Patient presents with  . Right Hand - Pain   Visit Diagnoses:  1. Right carpal tunnel syndrome   2. Arthritis of carpometacarpal (CMC) joint of right thumb   3. S/P carpal tunnel release     Plan: Patient is a pleasant 54 year old female who comes in today 2 weeks out right carpal tunnel release and right first [facility] arthroplasty 12/20/2019.  He has been doing okay.  She is no longer taking any narcotic pain medication.  She denies any fevers or chills and she denies any numbness, tingling or burning.  Examination of the right hand and wrist reveals well-healing surgical incisions with nylon sutures in place.  No evidence of infection or cellulitis.  Does have moderate swelling to the right hand.  Fingers are warm and well-perfused.  At this point, sutures were removed and Steri-Strips applied.  We will place the patient in a thumb spica cast nonweightbearing for the next 2 weeks.  She will elevate as much as possible for swelling.  Follow-up with [place] in 2 weeks time for repeat evaluation.  Call with concerns or questions in meantime.   Follow-Up Instructions: Return in about 2 weeks (around 01/17/2020).   Orders:  Orders Placed This Encounter  Procedures  . XR Hand Complete Right   No orders of the defined types were placed in this encounter.   Imaging: XR Hand Complete Right  Result Date: 01/03/2020 X-rays demonstrate status post [facility] arthroplasty with trapeziectomy   PMFS History: Patient Active Problem List   Diagnosis Date Noted  . Right carpal tunnel syndrome 11/21/2019  . Arthritis of carpometacarpal [facility]) joint of right thumb 09/27/2019  . 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 Carpal tunnel syndrome, right upper limb 03/27/2019    . Moody 07/19/2018  . Night sweats 07/19/2018  . Hot flashes due to menopause 07/19/2018  . Screening for colorectal cancer 07/19/2018  . Encounter for gynecological examination with Papanicolaou smear of cervix 07/19/2018  . History of HPV infection 07/19/2018  . Vesicles 05/07/2015  . Contraceptive management 05/07/2015  . Vaginal discharge 11/29/2013  . BV (bacterial vaginosis) 11/29/2013   Past Medical History:  Diagnosis Date  . Allergy   . Anxiety   . Asthma   . BV (bacterial vaginosis) 11/29/2013  . Contraceptive management 05/07/2015  . Hypertension   . Plantar fasciitis   . Thyroid disease   . Vaginal discharge 11/29/2013  . Vesicles 05/07/2015    Family History  Problem Relation Age of Onset  . Hypertension Mother   . Hypertension Father   . Hypertension Sister   . Asthma Paternal Aunt   . Heart disease Paternal Grandmother   . Heart disease Paternal Grandfather     Past Surgical History:  Procedure Laterality Date  . CARPAL TUNNEL RELEASE    . ELBOW ARTHROSCOPY Right   . SHOULDER ARTHROSCOPY Right   . THYROIDECTOMY    . WISDOM TOOTH EXTRACTION     Social History   Occupational History  . Not on file  Tobacco Use  . Smoking status: Never Smoker  . Smokeless tobacco: Never Used  Substance and Sexual Activity  . Alcohol use: No  .  Drug use: No  . Sexual activity: Not Currently    Birth control/protection: Post-menopausal

## 2020-01-17 ENCOUNTER — Ambulatory Visit (INDEPENDENT_AMBULATORY_CARE_PROVIDER_SITE_OTHER): Payer: 59 | Admitting: Orthopaedic Surgery

## 2020-01-17 ENCOUNTER — Encounter: Payer: Self-pay | Admitting: Orthopaedic Surgery

## 2020-01-17 ENCOUNTER — Other Ambulatory Visit: Payer: Self-pay

## 2020-01-17 VITALS — Ht 66.5 in | Wt 206.0 lb

## 2020-01-17 DIAGNOSIS — G5601 Carpal tunnel syndrome, right upper limb: Secondary | ICD-10-CM

## 2020-01-17 DIAGNOSIS — M1811 Unilateral primary osteoarthritis of first carpometacarpal joint, right hand: Secondary | ICD-10-CM

## 2020-01-17 NOTE — Progress Notes (Signed)
Post-Op Visit Note   Patient: Ann Foley           Date of Birth: 06/11/1966           MRN: [id number] Visit Date: 01/17/2020 PCP: Jonathon Jordan, MD   Assessment & Plan:  Chief Complaint:  Chief Complaint  Patient presents with  . Right Hand - Follow-up    Right carpal tunnel release and [facility] arthroplasty DOS 12/20/2019   Visit Diagnoses:  1. Arthritis of carpometacarpal (CMC) joint of right thumb   2. Right carpal tunnel syndrome     Plan: Charryse is 4 weeks status post right carpal tunnel release and right CMC arthroplasty.  Overall she is doing well.  She has an occasional shooting pain in the thumb.  She has been taking ibuprofen as needed but not frequently.  Her surgical scars are healed.  We placed a little bit of mupirocin ointment on the [place] incisions.  We placed in a thumb spica brace to wear in public.  She will begin gentle range of motion.  I have written a prescription for outpatient hand and wrist therapy.  Given how stiff and weak her hand is she is not ready to return back to work in 2 weeks therefore she will need to be out of work for additional 4weeks at which point we will reevaluate her in the office.  Work note was provided today.  Recheck in 4 weeks.  Follow-Up Instructions: Return in about 4 weeks (around 02/14/2020).   Orders:  No orders of the defined types were placed in this encounter.  No orders of the defined types were placed in this encounter.   Imaging: No results found.  PMFS History: Patient Active Problem List   Diagnosis Date Noted  . Right carpal tunnel syndrome 11/21/2019  . Arthritis of carpometacarpal [facility]) joint of right thumb 09/27/2019  . 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 Carpal tunnel syndrome, right upper limb 03/27/2019  . Moody 07/19/2018  . Night sweats 07/19/2018  . Hot flashes due to menopause 07/19/2018  . Screening for colorectal cancer 07/19/2018  . Encounter for gynecological  examination with Papanicolaou smear of cervix 07/19/2018  . History of HPV infection 07/19/2018  . Vesicles 05/07/2015  . Contraceptive management 05/07/2015  . Vaginal discharge 11/29/2013  . BV (bacterial vaginosis) 11/29/2013   Past Medical History:  Diagnosis Date  . Allergy   . Anxiety   . Asthma   . BV (bacterial vaginosis) 11/29/2013  . Contraceptive management 05/07/2015  . Hypertension   . Plantar fasciitis   . Thyroid disease   . Vaginal discharge 11/29/2013  . Vesicles 05/07/2015    Family History  Problem Relation Age of Onset  . Hypertension Mother   . Hypertension Father   . Hypertension Sister   . Asthma Paternal Aunt   . Heart disease Paternal Grandmother   . Heart disease Paternal Grandfather     Past Surgical History:  Procedure Laterality Date  . CARPAL TUNNEL RELEASE    . ELBOW ARTHROSCOPY Right   . SHOULDER ARTHROSCOPY Right   . THYROIDECTOMY    . WISDOM TOOTH EXTRACTION     Social History   Occupational History  . Not on file  Tobacco Use  . Smoking status: Never Smoker  . Smokeless tobacco: Never Used  Substance and Sexual Activity  . Alcohol use: No  . Drug use: No  . Sexual activity: Not Currently    Birth control/protection: Post-menopausal

## 2020-02-14 ENCOUNTER — Other Ambulatory Visit: Payer: Self-pay

## 2020-02-14 ENCOUNTER — Encounter: Payer: Self-pay | Admitting: Orthopaedic Surgery

## 2020-02-14 ENCOUNTER — Ambulatory Visit (INDEPENDENT_AMBULATORY_CARE_PROVIDER_SITE_OTHER): Payer: 59 | Admitting: Orthopaedic Surgery

## 2020-02-14 DIAGNOSIS — M1811 Unilateral primary osteoarthritis of first carpometacarpal joint, right hand: Secondary | ICD-10-CM

## 2020-02-14 DIAGNOSIS — G5601 Carpal tunnel syndrome, right upper limb: Secondary | ICD-10-CM

## 2020-02-14 NOTE — Progress Notes (Signed)
      Patient: Ann Foley           Date of Birth: 1966/06/26           MRN: [id number] Visit Date: 02/14/2020 PCP: Jonathon Jordan, MD   Assessment & Plan:  Chief Complaint:  Chief Complaint  Patient presents with  . Right Thumb - Pain   Visit Diagnoses:  1. Arthritis of carpometacarpal (CMC) joint of right thumb   2. Right carpal tunnel syndrome     Plan: Joseph Art has done very well from her surgeries.  Do feel that she needs to do a couple more weeks of hand therapy so that she can return back to work safely.  She will continue to work on range of motion and strengthening with hand therapy.  I would like to recheck her in 6 weeks.  Follow-Up Instructions: Return in about 6 weeks (around 03/27/2020).   Orders:  No orders of the defined types were placed in this encounter.  No orders of the defined types were placed in this encounter.   Imaging: No results found.  PMFS History: Patient Active Problem List   Diagnosis Date Noted  . Right carpal tunnel syndrome 11/21/2019  . Arthritis of carpometacarpal [facility]) joint of right thumb 09/27/2019  . 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 Carpal tunnel syndrome, right upper limb 03/27/2019  . Moody 07/19/2018  . Night sweats 07/19/2018  . Hot flashes due to menopause 07/19/2018  . Screening for colorectal cancer 07/19/2018  . Encounter for gynecological examination with Papanicolaou smear of cervix 07/19/2018  . History of HPV infection 07/19/2018  . Vesicles 05/07/2015  . Contraceptive management 05/07/2015  . Vaginal discharge 11/29/2013  . BV (bacterial vaginosis) 11/29/2013   Past Medical History:  Diagnosis Date  . Allergy   . Anxiety   . Asthma   . BV (bacterial vaginosis) 11/29/2013  . Contraceptive management 05/07/2015  . Hypertension   . Plantar fasciitis   . Thyroid disease   . Vaginal discharge 11/29/2013  . Vesicles 05/07/2015    Family History  Problem Relation Age of  Onset  . Hypertension Mother   . Hypertension Father   . Hypertension Sister   . Asthma Paternal Aunt   . Heart disease Paternal Grandmother   . Heart disease Paternal Grandfather     Past Surgical History:  Procedure Laterality Date  . CARPAL TUNNEL RELEASE    . ELBOW ARTHROSCOPY Right   . SHOULDER ARTHROSCOPY Right   . THYROIDECTOMY    . WISDOM TOOTH EXTRACTION     Social History   Occupational History  . Not on file  Tobacco Use  . Smoking status: Never Smoker  . Smokeless tobacco: Never Used  Substance and Sexual Activity  . Alcohol use: No  . Drug use: No  . Sexual activity: Not Currently    Birth control/protection: Post-menopausal

## 2020-03-14 ENCOUNTER — Other Ambulatory Visit (HOSPITAL_COMMUNITY): Payer: Self-pay | Admitting: Obstetrics and Gynecology

## 2020-03-14 DIAGNOSIS — Z1231 Encounter for screening mammogram for malignant neoplasm of breast: Secondary | ICD-10-CM

## 2020-03-27 ENCOUNTER — Ambulatory Visit (INDEPENDENT_AMBULATORY_CARE_PROVIDER_SITE_OTHER): Payer: 59 | Admitting: Orthopaedic Surgery

## 2020-03-27 ENCOUNTER — Other Ambulatory Visit: Payer: Self-pay

## 2020-03-27 ENCOUNTER — Encounter: Payer: Self-pay | Admitting: Orthopaedic Surgery

## 2020-03-27 DIAGNOSIS — M1811 Unilateral primary osteoarthritis of first carpometacarpal joint, right hand: Secondary | ICD-10-CM

## 2020-03-27 NOTE — Progress Notes (Signed)
   Post-Op Visit Note   Patient: Ann Foley           Date of Birth: 1966-08-24           MRN: [id number] Visit Date: 03/27/2020 PCP: Jonathon Jordan, MD   Assessment & Plan:  Chief Complaint:  Chief Complaint  Patient presents with  . Right Hand - Follow-up    Right carpal tunnel release and [facility] arthroplasty DOS 12-20-2019   Visit Diagnoses:  1. Arthritis of carpometacarpal (CMC) joint of right thumb     Plan: Ann Foley is 49-month status post right carpal tunnel release and CMC arthroplasty.  She is overall doing well has completed hand therapy.  Her only complaint is just some weakness in opening jars and AND occasional shooting pain of the wrist but overall the pain is is completely gone.  She is very happy overall. Surgical scars are fully healed.  She has significant improvement in pinch and grip strength.  Her range of motion is nearly symmetric to the contralateral side.  At this point she has recovered very well from the surgery.  She will continue with hand therapy strengthening exercises.  We will see her back as needed.  Follow-Up Instructions: Return if symptoms worsen or fail to improve.   Orders:  No orders of the defined types were placed in this encounter.  No orders of the defined types were placed in this encounter.   Imaging: No results found.  PMFS History: Patient Active Problem List   Diagnosis Date Noted  . Right carpal tunnel syndrome 11/21/2019  . Arthritis of carpometacarpal [facility]) joint of right thumb 09/27/2019  . 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 Carpal tunnel syndrome, right upper limb 03/27/2019  . Moody 07/19/2018  . Night sweats 07/19/2018  . Hot flashes due to menopause 07/19/2018  . Screening for colorectal cancer 07/19/2018  . Encounter for gynecological examination with Papanicolaou smear of cervix 07/19/2018  . History of HPV infection 07/19/2018  . Vesicles 05/07/2015  . Contraceptive management  05/07/2015  . Vaginal discharge 11/29/2013  . BV (bacterial vaginosis) 11/29/2013   Past Medical History:  Diagnosis Date  . Allergy   . Anxiety   . Asthma   . BV (bacterial vaginosis) 11/29/2013  . Contraceptive management 05/07/2015  . Hypertension   . Plantar fasciitis   . Thyroid disease   . Vaginal discharge 11/29/2013  . Vesicles 05/07/2015    Family History  Problem Relation Age of Onset  . Hypertension Mother   . Hypertension Father   . Hypertension Sister   . Asthma Paternal Aunt   . Heart disease Paternal Grandmother   . Heart disease Paternal Grandfather     Past Surgical History:  Procedure Laterality Date  . CARPAL TUNNEL RELEASE    . ELBOW ARTHROSCOPY Right   . SHOULDER ARTHROSCOPY Right   . THYROIDECTOMY    . WISDOM TOOTH EXTRACTION     Social History   Occupational History  . Not on file  Tobacco Use  . Smoking status: Never Smoker  . Smokeless tobacco: Never Used  Substance and Sexual Activity  . Alcohol use: No  . Drug use: No  . Sexual activity: Not Currently    Birth control/protection: Post-menopausal

## 2020-03-31 ENCOUNTER — Ambulatory Visit (HOSPITAL_COMMUNITY)
Admission: RE | Admit: 2020-03-31 | Discharge: 2020-03-31 | Disposition: A | Discharge status: Home / Self Care | Payer: 59 | Source: Ambulatory Visit | Attending: Obstetrics and Gynecology | Admitting: Obstetrics and Gynecology

## 2020-03-31 ENCOUNTER — Other Ambulatory Visit: Payer: Self-pay

## 2020-03-31 DIAGNOSIS — Z1231 Encounter for screening mammogram for malignant neoplasm of breast: Secondary | ICD-10-CM | POA: Insufficient documentation

## 2020-09-02 ENCOUNTER — Ambulatory Visit (HOSPITAL_COMMUNITY): Payer: 59 | Attending: Orthopedic Surgery | Admitting: Physical Therapy

## 2020-09-02 ENCOUNTER — Other Ambulatory Visit: Payer: Self-pay

## 2020-09-02 ENCOUNTER — Encounter (HOSPITAL_COMMUNITY): Payer: Self-pay | Admitting: Physical Therapy

## 2020-09-02 DIAGNOSIS — M533 Sacrococcygeal disorders, not elsewhere classified: Secondary | ICD-10-CM | POA: Insufficient documentation

## 2020-09-02 DIAGNOSIS — M545 Low back pain, unspecified: Secondary | ICD-10-CM | POA: Diagnosis not present

## 2020-09-02 NOTE — Patient Instructions (Signed)
Access Code: [email or web address] URL: [email or web address] Date: 09/02/2020 Prepared by: Josue Hector  Exercises Hooklying Isometric Hip Abduction with Belt - 2 x daily - 7 x weekly - 1-2 sets - 10 reps - 5 seconds hold Supine Hip Adduction Isometric with Ball - 2 x daily - 7 x weekly - 1-2 sets - 10 reps - seconds hold Supine Bridge - 2 x daily - 7 x weekly - 1-2 sets - 10 reps - seconds hold

## 2020-09-02 NOTE — Therapy (Signed)
[place], [place], [address] Phone: [phone]   Fax:  [phone]  Physical Therapy Evaluation  Patient Details  Name: Ann Foley MRN: [id number] Date of Birth: 06/30/1966 Referring Provider (PT): Melina Schools MD    Encounter Date: 09/02/2020   PT End of Session - 09/02/20 0847    Visit Number 1    Number of Visits 8    Date for PT Re-Evaluation 10/03/20    Authorization Type [employer] (no auth, 60 VL, 50 remain)    Authorization - Visit Number 1    Authorization - Number of Visits 50    PT Start Time 0815    PT Stop Time 0900    PT Time Calculation (min) 45 min    Activity Tolerance Patient tolerated treatment well    Behavior During Therapy [facility] for tasks assessed/performed           Past Medical History:  Diagnosis Date  . Allergy   . Anxiety   . Asthma   . BV (bacterial vaginosis) 11/29/2013  . Contraceptive management 05/07/2015  . Hypertension   . Plantar fasciitis   . Thyroid disease   . Vaginal discharge 11/29/2013  . Vesicles 05/07/2015    Past Surgical History:  Procedure Laterality Date  . CARPAL TUNNEL RELEASE    . ELBOW ARTHROSCOPY Right   . SHOULDER ARTHROSCOPY Right   . THYROIDECTOMY    . WISDOM TOOTH EXTRACTION      There were no vitals filed for this visit.    Subjective Assessment - 09/02/20 0821    Subjective Patient presents to physical therapy with complaint of LT side LBP. She says this has been going on for about 8 months. Not related to any injury. Says she had recent xrays which showed some curvature in her spine. She has been to the chiropractor but not found this helpful. Takes Advil and Tylenol PRN.    Limitations Sitting    How long can you sit comfortably? up to 30 min    Diagnostic tests xrays    Patient Stated Goals be pain free    Currently in Pain? Yes    Pain Score 5     Pain Location Back    Pain Orientation Left;Posterior    Pain  Descriptors / Indicators Aching    Pain Type Acute pain    Pain Onset More than a month ago    Pain Frequency Constant    Aggravating Factors  sitting too long    Pain Relieving Factors Not sure    Effect of Pain on Daily Activities Limits              [facility] PT Assessment - 09/02/20 0001      Assessment   Medical Diagnosis LBP    Referring Provider (PT) Melina Schools MD     Onset Date/Surgical Date --   March 2021   Prior Therapy Not for back       Precautions   Precautions None      Restrictions   Weight Bearing Restrictions No      Balance Screen   Has the patient fallen in the past 6 months Yes    How many times? 4    Has the patient had a decrease in activity level because of a fear of falling?  No    Is the patient reluctant to leave their home because of a fear of falling?  No  Home Environment   Living Environment Private residence    Living Arrangements Alone      Prior Function   Level of Independence Independent    Vocation Full time employment    Vocation Requirements Clerical/ desk work       Cognition   Overall Cognitive Status Within Functional Limits for tasks assessed      Observation/Other Assessments   Focus on Therapeutic Outcomes (FOTO)  43% limited       Posture/Postural Control   Posture/Postural Control Postural limitations    Postural Limitations Increased lumbar lordosis      ROM / Strength   AROM / PROM / Strength AROM;Strength      AROM   AROM Assessment Site Lumbar    Lumbar Flexion 50% limited     Lumbar Extension 75% limited     Lumbar - Right Side Bend WFL     Lumbar - Left Side Bend 10% limited     Lumbar - Right Rotation WFL     Lumbar - Left Rotation [facility]       Strength   Strength Assessment Site Hip;Knee;Ankle    Right/Left Hip Right;Left    Right Hip Flexion 5/5    Right Hip Extension 4+/5    Right Hip ABduction 4+/5    Left Hip Flexion 4/5    Left Hip Extension 3-/5    Left Hip ABduction 3+/5     Right/Left Knee Right;Left    Right Knee Extension 5/5    Left Knee Extension 4/5    Right/Left Ankle Right;Left    Right Ankle Dorsiflexion 5/5    Left Ankle Dorsiflexion 4+/5      Palpation   Palpation comment Mod TTP about LT SI sulcus       Special Tests    Special Tests --   (-) LLD; (+) sacral compression      Ambulation/Gait   Ambulation/Gait Yes    Gait Comments Unremarkable except decreased arm swing                       Objective measurements completed on examination: See above findings.       [facility] Adult PT Treatment/Exercise - 09/02/20 0001      Exercises   Exercises Knee/Hip      Knee/Hip Exercises: Supine   Bridges Both;5 reps    Other Supine Knee/Hip Exercises iso hip abd/ add MET 6 x 5" each                   PT Education - 09/02/20 0823    Education Details on evaluation findings, POC and HEP    Person(s) Educated Patient    Methods Explanation;Handout    Comprehension Verbalized understanding            PT Short Term Goals - 09/02/20 0856      PT SHORT TERM GOAL #1   Title Patient will be independent with initial HEP and self-management strategies to improve functional outcomes    Time 2    Period Weeks    Status New    Target Date 09/19/20             PT Long Term Goals - 09/02/20 0857      PT LONG TERM GOAL #1   Title Patient will improve FOTO score by 10% to indicate improvement in functional outcomes    Time 4    Period Weeks    Status New  Target Date 10/03/20      PT LONG TERM GOAL #2   Title Patient will report at least 75% overall improvement in subjective complaint to indicate improvement in ability to perform ADLs.    Time 4    Period Weeks    Status New    Target Date 10/03/20      PT LONG TERM GOAL #3   Title Patient will improve lumbar AROM by 25% in all restricted planes for improved ability to perform functional mobility tasks and ADLs.    Time 4    Period Weeks    Status New     Target Date 10/03/20      PT LONG TERM GOAL #4   Title Patient will be able to sit comfortably > 60 minutes to improve ability to perform work tasks.    Time 4    Period Weeks    Status New    Target Date 10/03/20                  Plan - 09/02/20 0849    Clinical Impression Statement Patient is a 54 y.o. female who presents to physical therapy with complaint of LT low back pain. Patient demonstrates decreased strength, ROM restriction, increased tenderness to palpation and postural abnormalities which are likely contributing to symptoms of pain and are negatively impacting patient ability to perform ADLs and functional mobility tasks. Patient will benefit from skilled physical therapy services to address these deficits to reduce pain and improve level of function with ADLs and functional mobility tasks.    Examination-Activity Limitations Sit;Bend;Locomotion Level;Stand;Lift;Transfers    Examination-Participation Restrictions Cleaning;Yard Work;Community Activity;Occupation;Laundry    Stability/Clinical Decision Making Stable/Uncomplicated    Clinical Decision Making Low    Rehab Potential Good    PT Frequency 2x / week    PT Duration 4 weeks    PT Treatment/Interventions ADLs/Self Care Home Management;Aquatic Therapy;Biofeedback;Cryotherapy;Moist Heat;Fluidtherapy;Therapeutic activities;Traction;Patient/family education;Ultrasound;Parrafin;Functional mobility training;Neuromuscular re-education;Stair training;Orthotic Fit/Training;Manual techniques;Manual lymph drainage;Balance training;Therapeutic exercise;Contrast Bath;DME Instruction;Gait training;Electrical Stimulation;Iontophoresis 4mg /ml Dexamethasone;Compression bandaging;Scar mobilization;Vasopneumatic Device;Taping;Joint Manipulations;Vestibular;Visual/perceptual remediation/compensation;Passive range of motion;Dry needling;Energy conservation;Splinting;Spinal Manipulations    PT Next Visit Plan Review goals and HEP. Progress  core and hip strength as tolerated with focus on LT hip strength and SI joint dysfunction.    PT Home Exercise Plan Eval: iso hip abd, iso hip add, bridge    Consulted and Agree with Plan of Care Patient           Patient will benefit from skilled therapeutic intervention in order to improve the following deficits and impairments:  Pain, Improper body mechanics, Increased fascial restricitons, Decreased activity tolerance, Decreased balance, Decreased strength, Decreased range of motion, Hypomobility, Impaired perceived functional ability, Postural dysfunction  Visit Diagnosis: Low back pain, unspecified back pain laterality, unspecified chronicity, unspecified whether sciatica present  Sacrococcygeal disorders, not elsewhere classified     Problem List Patient Active Problem List   Diagnosis Date Noted  . Right carpal tunnel syndrome 11/21/2019  . Arthritis of carpometacarpal [facility]) joint of right thumb 09/27/2019  . 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 Carpal tunnel syndrome, right upper limb 03/27/2019  . Moody 07/19/2018  . Night sweats 07/19/2018  . Hot flashes due to menopause 07/19/2018  . Screening for colorectal cancer 07/19/2018  . Encounter for gynecological examination with Papanicolaou smear of cervix 07/19/2018  . History of HPV infection 07/19/2018  . Vesicles 05/07/2015  . Contraceptive management 05/07/2015  . Vaginal discharge 11/29/2013  . BV (bacterial  vaginosis) 11/29/2013    9:04 AM, 09/02/20 Josue Hector PT DPT  Physical Therapist with [facility]  [phone] [place] [address] [place], [place], [address] Phone: [phone]   Fax:  [phone]  Name: Ann Foley MRN: [id number] Date of Birth: 08/15/1966

## 2020-09-09 ENCOUNTER — Ambulatory Visit (HOSPITAL_COMMUNITY): Payer: 59 | Admitting: Physical Therapy

## 2020-09-09 ENCOUNTER — Other Ambulatory Visit: Payer: Self-pay

## 2020-09-09 DIAGNOSIS — M545 Low back pain, unspecified: Secondary | ICD-10-CM | POA: Diagnosis not present

## 2020-09-09 DIAGNOSIS — M533 Sacrococcygeal disorders, not elsewhere classified: Secondary | ICD-10-CM

## 2020-09-09 NOTE — Therapy (Signed)
[place], [place], [address] Phone: [phone]   Fax:  [phone]  Physical Therapy Treatment  Patient Details  Name: RORIE DELMORE MRN: [id number] Date of Birth: 1966/08/07 Referring Provider (PT): Melina Schools MD    Encounter Date: 09/09/2020   PT End of Session - 09/09/20 1700    Visit Number 2    Number of Visits 8    Date for PT Re-Evaluation 10/03/20    Authorization Type [employer] (no auth, 60 VL, 50 remain)    Authorization - Visit Number 2    Authorization - Number of Visits 50    Activity Tolerance Patient tolerated treatment well    Behavior During Therapy [facility] for tasks assessed/performed           Past Medical History:  Diagnosis Date  . Allergy   . Anxiety   . Asthma   . BV (bacterial vaginosis) 11/29/2013  . Contraceptive management 05/07/2015  . Hypertension   . Plantar fasciitis   . Thyroid disease   . Vaginal discharge 11/29/2013  . Vesicles 05/07/2015    Past Surgical History:  Procedure Laterality Date  . CARPAL TUNNEL RELEASE    . ELBOW ARTHROSCOPY Right   . SHOULDER ARTHROSCOPY Right   . THYROIDECTOMY    . WISDOM TOOTH EXTRACTION      There were no vitals filed for this visit.   Subjective Assessment - 09/09/20 1708    Subjective pt states she is doing well today overall, just a little sorenss/tightness in Lt SI region.    Currently in Pain? Yes    Pain Score 2     Pain Location Back    Pain Orientation Left;Posterior                             [facility] Adult PT Treatment/Exercise - 09/09/20 0001      Knee/Hip Exercises: Stretches   Active Hamstring Stretch Both;2 reps;20 seconds    Active Hamstring Stretch Limitations with towel in supine    Piriformis Stretch Both;2 reps;30 seconds    Piriformis Stretch Limitations seated      Knee/Hip Exercises: Supine   Bridges 10 reps    Straight Leg Raises 10 reps    Straight Leg Raises  Limitations with core stab      Knee/Hip Exercises: Sidelying   Hip ABduction 10 reps      Knee/Hip Exercises: Prone   Hip Extension Both;10 reps    Other Prone Exercises heelsqueezes 10X5"      Manual Therapy   Manual Therapy Soft tissue mobilization    Manual therapy comments completed at EOS in prone lying to Lt glute and LB region    Soft tissue mobilization to Lt glute and LB to reduce spasm and tightness                    PT Short Term Goals - 09/09/20 1703      PT SHORT TERM GOAL #1   Title Patient will be independent with initial HEP and self-management strategies to improve functional outcomes    Time 2    Period Weeks    Status On-going    Target Date 09/19/20             PT Long Term Goals - 09/09/20 1703      PT LONG TERM GOAL #1   Title Patient will improve FOTO  score by 10% to indicate improvement in functional outcomes    Time 4    Period Weeks    Status On-going      PT LONG TERM GOAL #2   Title Patient will report at least 75% overall improvement in subjective complaint to indicate improvement in ability to perform ADLs.    Time 4    Period Weeks    Status On-going      PT LONG TERM GOAL #3   Title Patient will improve lumbar AROM by 25% in all restricted planes for improved ability to perform functional mobility tasks and ADLs.    Time 4    Period Weeks    Status On-going      PT LONG TERM GOAL #4   Title Patient will be able to sit comfortably > 60 minutes to improve ability to perform work tasks.    Time 4    Period Weeks                 Plan - 09/09/20 1703    Clinical Impression Statement Reviewed goals and POC moving forward.  Began with SI check with no deficits found.  Progressed core stability exericses.  Instructed with seated piriformis stretch and supine hamstring stretch with some visable tightness.  Completed active strengthening for hip musculature without pain or discomfort noted during or at end of session.   Ended with manual therapy to LT glute and lumbar region. General tightness in this area reduced following manual.  Encouraged to complete self massage to this area and completed stretches daily.    Examination-Activity Limitations Sit;Bend;Locomotion Level;Stand;Lift;Transfers    Examination-Participation Restrictions Cleaning;Yard Work;Community Activity;Occupation;Laundry    Stability/Clinical Decision Making Stable/Uncomplicated    Rehab Potential Good    PT Frequency 2x / week    PT Duration 4 weeks    PT Treatment/Interventions ADLs/Self Care Home Management;Aquatic Therapy;Biofeedback;Cryotherapy;Moist Heat;Fluidtherapy;Therapeutic activities;Traction;Patient/family education;Ultrasound;Parrafin;Functional mobility training;Neuromuscular re-education;Stair training;Orthotic Fit/Training;Manual techniques;Manual lymph drainage;Balance training;Therapeutic exercise;Contrast Bath;DME Instruction;Gait training;Electrical Stimulation;Iontophoresis 4mg /ml Dexamethasone;Compression bandaging;Scar mobilization;Vasopneumatic Device;Taping;Joint Manipulations;Vestibular;Visual/perceptual remediation/compensation;Passive range of motion;Dry needling;Energy conservation;Splinting;Spinal Manipulations    PT Next Visit Plan Progress core and hip strength as tolerated with focus on LT hip strength and SI joint dysfunction.    PT Home Exercise Plan Eval: iso hip abd, iso hip add, bridge    Consulted and Agree with Plan of Care Patient           Patient will benefit from skilled therapeutic intervention in order to improve the following deficits and impairments:  Pain, Improper body mechanics, Increased fascial restricitons, Decreased activity tolerance, Decreased balance, Decreased strength, Decreased range of motion, Hypomobility, Impaired perceived functional ability, Postural dysfunction  Visit Diagnosis: Low back pain, unspecified back pain laterality, unspecified chronicity, unspecified whether  sciatica present  Sacrococcygeal disorders, not elsewhere classified     Problem List Patient Active Problem List   Diagnosis Date Noted  . Right carpal tunnel syndrome 11/21/2019  . Arthritis of carpometacarpal [facility]) joint of right thumb 09/27/2019  . 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 Carpal tunnel syndrome, right upper limb 03/27/2019  . Moody 07/19/2018  . Night sweats 07/19/2018  . Hot flashes due to menopause 07/19/2018  . Screening for colorectal cancer 07/19/2018  . Encounter for gynecological examination with Papanicolaou smear of cervix 07/19/2018  . History of HPV infection 07/19/2018  . Vesicles 05/07/2015  . Contraceptive management 05/07/2015  . Vaginal discharge 11/29/2013  . BV (bacterial vaginosis) 11/29/2013   Teena Irani, PTA/CLT  [phone]  Teena Irani 09/09/2020, 5:08 PM  [place] [address] [place], [place], [address] Phone: [phone]   Fax:  [phone]  Name: GENAVIE BOETTGER MRN: [id number] Date of Birth: 07-Feb-1966

## 2020-09-12 ENCOUNTER — Ambulatory Visit (HOSPITAL_COMMUNITY): Payer: 59 | Attending: Orthopedic Surgery

## 2020-09-12 ENCOUNTER — Other Ambulatory Visit: Payer: Self-pay

## 2020-09-12 ENCOUNTER — Encounter (HOSPITAL_COMMUNITY): Payer: Self-pay

## 2020-09-12 DIAGNOSIS — M533 Sacrococcygeal disorders, not elsewhere classified: Secondary | ICD-10-CM | POA: Diagnosis present

## 2020-09-12 DIAGNOSIS — M545 Low back pain, unspecified: Secondary | ICD-10-CM | POA: Insufficient documentation

## 2020-09-12 NOTE — Therapy (Signed)
[place] [address], [place], [address] Phone: [phone]   Fax:  [phone]  Physical Therapy Treatment  Patient Details  Name: Ann Foley MRN: [id number] Date of Birth: 12-24-1965 Referring Provider (PT): Melina Schools MD    Encounter Date: 09/12/2020   PT End of Session - 09/12/20 0848    Visit Number 3    Number of Visits 8    Date for PT Re-Evaluation 10/03/20    Authorization Type [employer] (no auth, 60 VL, 50 remain)    Authorization - Visit Number 3    Authorization - Number of Visits 50    PT Start Time [phone]    PT Stop Time 0915    PT Time Calculation (min) 43 min           Past Medical History:  Diagnosis Date  . Allergy   . Anxiety   . Asthma   . BV (bacterial vaginosis) 11/29/2013  . Contraceptive management 05/07/2015  . Hypertension   . Plantar fasciitis   . Thyroid disease   . Vaginal discharge 11/29/2013  . Vesicles 05/07/2015    Past Surgical History:  Procedure Laterality Date  . CARPAL TUNNEL RELEASE    . ELBOW ARTHROSCOPY Right   . SHOULDER ARTHROSCOPY Right   . THYROIDECTOMY    . WISDOM TOOTH EXTRACTION      There were no vitals filed for this visit.   Subjective Assessment - 09/12/20 0834    Subjective Pt stated she continues to have sore achey constant pain on Lt SI region, pain scale 4/10.    Patient Stated Goals be pain free    Currently in Pain? Yes    Pain Score 4     Pain Location Back    Pain Orientation Left;Lower    Pain Descriptors / Indicators Aching;Sore    Pain Type Acute pain;Chronic pain    Pain Onset More than a month ago    Pain Frequency Constant    Aggravating Factors  sitting too long    Pain Relieving Factors Not sure, pain medication    Effect of Pain on Daily Activities Limits                          2MW with core engangement following pubic clearing exercises   [facility] Adult PT Treatment/Exercise - 09/12/20 0001       Knee/Hip Exercises: Stretches   Hip Flexor Stretch 2 reps;30 seconds    Hip Flexor Stretch Limitations Thomas stretch on edge of bed    Other Knee/Hip Stretches LTR on ball 5x 10"      Knee/Hip Exercises: Supine   Bridges 10 reps    Bridges Limitations Lt foot closer to buttock    Other Supine Knee/Hip Exercises pubic clearing with iso hip abd/add 10x 5"    Other Supine Knee/Hip Exercises heel slides on theraball 10x with ab set. TRA with cervical flexion 5"      Manual Therapy   Manual Therapy Other (comment)    Manual therapy comments Manual complete separate than rest of tx    Soft tissue mobilization to Lt glute and LB to reduce spasm and tightness    Other Manual Therapy Checked SI alignment, possible Lt anterior rotation though no LLD so no MET complete.  Did perform pubic clearing                    PT  Short Term Goals - 09/09/20 1703      PT SHORT TERM GOAL #1   Title Patient will be independent with initial HEP and self-management strategies to improve functional outcomes    Time 2    Period Weeks    Status On-going    Target Date 09/19/20             PT Long Term Goals - 09/09/20 1703      PT LONG TERM GOAL #1   Title Patient will improve FOTO score by 10% to indicate improvement in functional outcomes    Time 4    Period Weeks    Status On-going      PT LONG TERM GOAL #2   Title Patient will report at least 75% overall improvement in subjective complaint to indicate improvement in ability to perform ADLs.    Time 4    Period Weeks    Status On-going      PT LONG TERM GOAL #3   Title Patient will improve lumbar AROM by 25% in all restricted planes for improved ability to perform functional mobility tasks and ADLs.    Time 4    Period Weeks    Status On-going      PT LONG TERM GOAL #4   Title Patient will be able to sit comfortably > 60 minutes to improve ability to perform work tasks.    Time 4    Period Weeks                  Plan - 09/12/20 0848    Clinical Impression Statement Began session checking SI alingment with possible SI anterior rotation through no LLD so no MET complete today.  Noted increased lordacic curvature, educated pt on importance of proper alingment and added core strengthening exercises.  Noted hip flexor tightness so additional Thomas stretch.  Verbal and tactile cueing for TrA activation paired wiht exhalation to reduce holding breath.  STM complete at EOS to address tightness with reports of pain reduced.   Examination-Activity Limitations Sit;Bend;Locomotion Level;Stand;Lift;Transfers    Examination-Participation Restrictions Cleaning;Yard Work;Community Activity;Occupation;Laundry    Stability/Clinical Decision Making Stable/Uncomplicated    Clinical Decision Making Low    Rehab Potential Good    PT Frequency 2x / week    PT Duration 4 weeks    PT Treatment/Interventions ADLs/Self Care Home Management;Aquatic Therapy;Biofeedback;Cryotherapy;Moist Heat;Fluidtherapy;Therapeutic activities;Traction;Patient/family education;Ultrasound;Parrafin;Functional mobility training;Neuromuscular re-education;Stair training;Orthotic Fit/Training;Manual techniques;Manual lymph drainage;Balance training;Therapeutic exercise;Contrast Bath;DME Instruction;Gait training;Electrical Stimulation;Iontophoresis 35m/ml Dexamethasone;Compression bandaging;Scar mobilization;Vasopneumatic Device;Taping;Joint Manipulations;Vestibular;Visual/perceptual remediation/compensation;Passive range of motion;Dry needling;Energy conservation;Splinting;Spinal Manipulations    PT Next Visit Plan Progress core and hip strength as tolerated with focus on LT hip strength and SI joint dysfunction.    PT Home Exercise Plan Eval: iso hip abd, iso hip add, bridge           Patient will benefit from skilled therapeutic intervention in order to improve the following deficits and impairments:  Pain, Improper body mechanics, Increased fascial  restricitons, Decreased activity tolerance, Decreased balance, Decreased strength, Decreased range of motion, Hypomobility, Impaired perceived functional ability, Postural dysfunction  Visit Diagnosis: Sacrococcygeal disorders, not elsewhere classified  Low back pain, unspecified back pain laterality, unspecified chronicity, unspecified whether sciatica present     Problem List Patient Active Problem List   Diagnosis Date Noted  . Right carpal tunnel syndrome 11/21/2019  . Arthritis of carpometacarpal ([facility] joint of right thumb 09/27/2019  . 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 Carpal tunnel syndrome,  right upper limb 03/27/2019  . Moody 07/19/2018  . Night sweats 07/19/2018  . Hot flashes due to menopause 07/19/2018  . Screening for colorectal cancer 07/19/2018  . Encounter for gynecological examination with Papanicolaou smear of cervix 07/19/2018  . History of HPV infection 07/19/2018  . Vesicles 05/07/2015  . Contraceptive management 05/07/2015  . Vaginal discharge 11/29/2013  . BV (bacterial vaginosis) 11/29/2013   Ihor Austin, LPTA/CLT; CBIS [phone]  Aldona Lento 09/12/2020, 5:04 PM  [place] [address] [place], [place], [address] Phone: [phone]   Fax:  [phone]  Name: Ann Foley MRN: [id number] Date of Birth: 1966/05/01

## 2020-09-17 ENCOUNTER — Ambulatory Visit (HOSPITAL_COMMUNITY): Payer: 59 | Admitting: Physical Therapy

## 2020-09-17 ENCOUNTER — Other Ambulatory Visit: Payer: Self-pay

## 2020-09-17 DIAGNOSIS — M545 Low back pain, unspecified: Secondary | ICD-10-CM

## 2020-09-17 DIAGNOSIS — M533 Sacrococcygeal disorders, not elsewhere classified: Secondary | ICD-10-CM

## 2020-09-17 NOTE — Therapy (Signed)
[place], [place], [address] Phone: [phone]   Fax:  [phone]  Physical Therapy Treatment  Patient Details  Name: Ann Foley MRN: [id number] Date of Birth: 03-Feb-1966 Referring Provider (PT): Melina Schools MD    Encounter Date: 09/17/2020   PT End of Session - 09/17/20 1103    Visit Number 4    Number of Visits 8    Date for PT Re-Evaluation 10/03/20    Authorization Type [employer] (no auth, 60 VL, 50 remain)    Authorization - Visit Number 4    Authorization - Number of Visits 50    PT Start Time [phone]    PT Stop Time 0915    PT Time Calculation (min) 41 min           Past Medical History:  Diagnosis Date  . Allergy   . Anxiety   . Asthma   . BV (bacterial vaginosis) 11/29/2013  . Contraceptive management 05/07/2015  . Hypertension   . Plantar fasciitis   . Thyroid disease   . Vaginal discharge 11/29/2013  . Vesicles 05/07/2015    Past Surgical History:  Procedure Laterality Date  . CARPAL TUNNEL RELEASE    . ELBOW ARTHROSCOPY Right   . SHOULDER ARTHROSCOPY Right   . THYROIDECTOMY    . WISDOM TOOTH EXTRACTION      There were no vitals filed for this visit.   Subjective Assessment - 09/17/20 0847    Subjective pt states she is about the same.  4/10 pain in Lt buttock/SI region.    Currently in Pain? Yes    Pain Score 4     Pain Location Hip    Pain Orientation Left    Pain Descriptors / Indicators Aching;Sore                             [facility] Adult PT Treatment/Exercise - 09/17/20 0001      Knee/Hip Exercises: Stretches   Active Hamstring Stretch Both;2 reps;20 seconds    Active Hamstring Stretch Limitations with towel in supine    Hip Flexor Stretch 2 reps;30 seconds    Hip Flexor Stretch Limitations shown in standing    Piriformis Stretch Both;2 reps;30 seconds    Piriformis Stretch Limitations shown in supine    Other Knee/Hip Stretches LTR on  ball 5x 10"      Knee/Hip Exercises: Supine   Bridges 10 reps;2 sets    Bridges Limitations Lt foot closer to buttock 1 set and normal 1 set    Straight Leg Raises 10 reps;Both;2 sets    Other Supine Knee/Hip Exercises pubic clearing with iso hip abd/add 10x 5"    Other Supine Knee/Hip Exercises heel slides on theraball 10x with ab set. TRA with cervical flexion 5"      Manual Therapy   Other Manual Therapy Checked SI alignment, possible Lt anterior rotation MET completed                     PT Short Term Goals - 09/09/20 1703      PT SHORT TERM GOAL #1   Title Patient will be independent with initial HEP and self-management strategies to improve functional outcomes    Time 2    Period Weeks    Status On-going    Target Date 09/19/20             PT  Long Term Goals - 09/09/20 1703      PT LONG TERM GOAL #1   Title Patient will improve FOTO score by 10% to indicate improvement in functional outcomes    Time 4    Period Weeks    Status On-going      PT LONG TERM GOAL #2   Title Patient will report at least 75% overall improvement in subjective complaint to indicate improvement in ability to perform ADLs.    Time 4    Period Weeks    Status On-going      PT LONG TERM GOAL #3   Title Patient will improve lumbar AROM by 25% in all restricted planes for improved ability to perform functional mobility tasks and ADLs.    Time 4    Period Weeks    Status On-going      PT LONG TERM GOAL #4   Title Patient will be able to sit comfortably > 60 minutes to improve ability to perform work tasks.    Time 4    Period Weeks                 Plan - 09/17/20 1102    Clinical Impression Statement Began with SI check with noted anterior rotation of Lt pelvis.  Muscle energy technique (MET) completed with noted correction following.  Continued with established core strengthening and stretching for tight musculature.  Instructed with alternate ways to complete hip  flexor and piriformis stretches today with good results.  Began hamstring curl core exercise using physioball with noted challenge and assist from therapist to stabilize ball.  5 reps only completed due to difficulty level.  Pt reported no significant change, slightly improved at EOS.    Examination-Activity Limitations Sit;Bend;Locomotion Level;Stand;Lift;Transfers    Examination-Participation Restrictions Cleaning;Yard Work;Community Activity;Occupation;Laundry    Stability/Clinical Decision Making Stable/Uncomplicated    Rehab Potential Good    PT Frequency 2x / week    PT Duration 4 weeks    PT Treatment/Interventions ADLs/Self Care Home Management;Aquatic Therapy;Biofeedback;Cryotherapy;Moist Heat;Fluidtherapy;Therapeutic activities;Traction;Patient/family education;Ultrasound;Parrafin;Functional mobility training;Neuromuscular re-education;Stair training;Orthotic Fit/Training;Manual techniques;Manual lymph drainage;Balance training;Therapeutic exercise;Contrast Bath;DME Instruction;Gait training;Electrical Stimulation;Iontophoresis 4mg /ml Dexamethasone;Compression bandaging;Scar mobilization;Vasopneumatic Device;Taping;Joint Manipulations;Vestibular;Visual/perceptual remediation/compensation;Passive range of motion;Dry needling;Energy conservation;Splinting;Spinal Manipulations    PT Next Visit Plan Progress core and hip strength as tolerated with focus on LT hip strength and SI joint dysfunction.    PT Home Exercise Plan Eval: iso hip abd, iso hip add, bridge           Patient will benefit from skilled therapeutic intervention in order to improve the following deficits and impairments:  Pain, Improper body mechanics, Increased fascial restricitons, Decreased activity tolerance, Decreased balance, Decreased strength, Decreased range of motion, Hypomobility, Impaired perceived functional ability, Postural dysfunction  Visit Diagnosis: Sacrococcygeal disorders, not elsewhere classified  Low  back pain, unspecified back pain laterality, unspecified chronicity, unspecified whether sciatica present     Problem List Patient Active Problem List   Diagnosis Date Noted  . Right carpal tunnel syndrome 11/21/2019  . Arthritis of carpometacarpal [facility]) joint of right thumb 09/27/2019  . 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 Carpal tunnel syndrome, right upper limb 03/27/2019  . Moody 07/19/2018  . Night sweats 07/19/2018  . Hot flashes due to menopause 07/19/2018  . Screening for colorectal cancer 07/19/2018  . Encounter for gynecological examination with Papanicolaou smear of cervix 07/19/2018  . History of HPV infection 07/19/2018  . Vesicles 05/07/2015  . Contraceptive management 05/07/2015  . Vaginal discharge  11/29/2013  . BV (bacterial vaginosis) 11/29/2013   Teena Irani, PTA/CLT [phone]  Teena Irani 09/17/2020, 11:04 AM  [place] [address] [place], [place], [address] Phone: [phone]   Fax:  [phone]  Name: Ann Foley MRN: [id number] Date of Birth: [birth year]

## 2020-09-22 ENCOUNTER — Other Ambulatory Visit: Payer: Self-pay

## 2020-09-22 ENCOUNTER — Encounter (HOSPITAL_COMMUNITY): Payer: Self-pay | Admitting: Physical Therapy

## 2020-09-22 ENCOUNTER — Ambulatory Visit (HOSPITAL_COMMUNITY): Payer: 59 | Admitting: Physical Therapy

## 2020-09-22 DIAGNOSIS — M545 Low back pain, unspecified: Secondary | ICD-10-CM

## 2020-09-22 DIAGNOSIS — M533 Sacrococcygeal disorders, not elsewhere classified: Secondary | ICD-10-CM

## 2020-09-22 NOTE — Patient Instructions (Signed)
Access Code: [email or web address] URL: [email or web address] Date: 09/22/2020 Prepared by: Josue Hector  Exercises Prone Press Up - 3 x daily - 7 x weekly - 1-2 sets - 10 reps

## 2020-09-22 NOTE — Therapy (Signed)
[place], [place], [address] Phone: [phone]   Fax:  [phone]  Physical Therapy Treatment  Patient Details  Name: Ann Foley MRN: [id number] Date of Birth: 02/15/1966 Referring Provider (PT): Melina Schools MD    Encounter Date: 09/22/2020   PT End of Session - 09/22/20 1354    Visit Number 5    Number of Visits 8    Date for PT Re-Evaluation 10/03/20    Authorization Type [employer] (no auth, 60 VL, 50 remain)    Authorization - Visit Number 5    Authorization - Number of Visits 50    PT Start Time 4562    PT Stop Time 1432    PT Time Calculation (min) 38 min    Activity Tolerance Patient tolerated treatment well    Behavior During Therapy [facility] for tasks assessed/performed           Past Medical History:  Diagnosis Date  . Allergy   . Anxiety   . Asthma   . BV (bacterial vaginosis) 11/29/2013  . Contraceptive management 05/07/2015  . Hypertension   . Plantar fasciitis   . Thyroid disease   . Vaginal discharge 11/29/2013  . Vesicles 05/07/2015    Past Surgical History:  Procedure Laterality Date  . CARPAL TUNNEL RELEASE    . ELBOW ARTHROSCOPY Right   . SHOULDER ARTHROSCOPY Right   . THYROIDECTOMY    . WISDOM TOOTH EXTRACTION      There were no vitals filed for this visit.   Subjective Assessment - 09/22/20 1358    Subjective Patient says she feels the same. Says she has been doing the exercises.    Currently in Pain? Yes    Pain Score 4     Pain Location Hip    Pain Orientation Left    Pain Descriptors / Indicators Aching    Pain Type Acute pain    Pain Onset More than a month ago    Pain Frequency Constant                             [facility] Adult PT Treatment/Exercise - 09/22/20 0001      Exercises   Exercises Lumbar      Lumbar Exercises: Stretches   Prone on Elbows Stretch 2 reps;60 seconds    Press Ups 2 reps;10 reps      Knee/Hip  Exercises: Supine   Bridges 2 sets;20 reps    Straight Leg Raises 2 sets;10 reps    Straight Leg Raises Limitations cues for ab brace      Manual Therapy   Manual Therapy Joint mobilization    Manual therapy comments Manual complete separate than rest of tx    Joint Mobilization PA grade II-III mobs to inferior sacrum and inferior RT side PA mobs for reduced pain at SI joint   with test retest of lumbar AROM and HIp ext MMT                   PT Short Term Goals - 09/09/20 1703      PT SHORT TERM GOAL #1   Title Patient will be independent with initial HEP and self-management strategies to improve functional outcomes    Time 2    Period Weeks    Status On-going    Target Date 09/19/20  PT Long Term Goals - 09/09/20 1703      PT LONG TERM GOAL #1   Title Patient will improve FOTO score by 10% to indicate improvement in functional outcomes    Time 4    Period Weeks    Status On-going      PT LONG TERM GOAL #2   Title Patient will report at least 75% overall improvement in subjective complaint to indicate improvement in ability to perform ADLs.    Time 4    Period Weeks    Status On-going      PT LONG TERM GOAL #3   Title Patient will improve lumbar AROM by 25% in all restricted planes for improved ability to perform functional mobility tasks and ADLs.    Time 4    Period Weeks    Status On-going      PT LONG TERM GOAL #4   Title Patient will be able to sit comfortably > 60 minutes to improve ability to perform work tasks.    Time 4    Period Weeks                 Plan - 09/22/20 1430    Clinical Impression Statement Patient tolerates session well overall today with no increased complaint of pain. Patient continues to have pain about LT SI joint sulcus which has been mostly unchanged with prior ther ex as reported by patient. Assessed response to lumbar extension-based exercise with POE and prone press ups. Patient reports overall no  change in symptoms. Trialed manual PA mobs to reduce LT SI joint pain. Patient reports no noticeable change in pain level but did show improvement in lumbar AROM as well as bilateral hip extension MMT as compared with eval. Explained findings with patient and added prone press ups to HEP with education of rationale. Patient will continue to benefit from skilled therapy services to progress hip and core strength to reduce pain and improve LOF with ADLs.    Examination-Activity Limitations Sit;Bend;Locomotion Level;Stand;Lift;Transfers    Examination-Participation Restrictions Cleaning;Yard Work;Community Activity;Occupation;Laundry    Stability/Clinical Decision Making Stable/Uncomplicated    Rehab Potential Good    PT Frequency 2x / week    PT Duration 4 weeks    PT Treatment/Interventions ADLs/Self Care Home Management;Aquatic Therapy;Biofeedback;Cryotherapy;Moist Heat;Fluidtherapy;Therapeutic activities;Traction;Patient/family education;Ultrasound;Parrafin;Functional mobility training;Neuromuscular re-education;Stair training;Orthotic Fit/Training;Manual techniques;Manual lymph drainage;Balance training;Therapeutic exercise;Contrast Bath;DME Instruction;Gait training;Electrical Stimulation;Iontophoresis 4mg /ml Dexamethasone;Compression bandaging;Scar mobilization;Vasopneumatic Device;Taping;Joint Manipulations;Vestibular;Visual/perceptual remediation/compensation;Passive range of motion;Dry needling;Energy conservation;Splinting;Spinal Manipulations    PT Next Visit Plan Progress core and hip strength as tolerated with focus on LT hip strength and SI joint dysfunction. Assess response to PPU. Progress core strength exercise.    PT Home Exercise Plan Eval: iso hip abd, iso hip add, bridge 09/22/20: PPU           Patient will benefit from skilled therapeutic intervention in order to improve the following deficits and impairments:  Pain,Improper body mechanics,Increased fascial restricitons,Decreased  activity tolerance,Decreased balance,Decreased strength,Decreased range of motion,Hypomobility,Impaired perceived functional ability,Postural dysfunction  Visit Diagnosis: Sacrococcygeal disorders, not elsewhere classified  Low back pain, unspecified back pain laterality, unspecified chronicity, unspecified whether sciatica present     Problem List Patient Active Problem List   Diagnosis Date Noted  . Right carpal tunnel syndrome 11/21/2019  . Arthritis of carpometacarpal [facility]) joint of right thumb 09/27/2019  . 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 Carpal tunnel syndrome, right upper limb 03/27/2019  . Moody 07/19/2018  . Night sweats 07/19/2018  .  Hot flashes due to menopause 07/19/2018  . Screening for colorectal cancer 07/19/2018  . Encounter for gynecological examination with Papanicolaou smear of cervix 07/19/2018  . History of HPV infection 07/19/2018  . Vesicles 05/07/2015  . Contraceptive management 05/07/2015  . Vaginal discharge 11/29/2013  . BV (bacterial vaginosis) 11/29/2013    2:33 PM, 09/22/20 Josue Hector PT DPT  Physical Therapist with [facility]  [phone] [place] [address] [place], [place], [address] Phone: [phone]   Fax:  [phone]  Name: KAVERI PERRAS MRN: [id number] Date of Birth: 1966/07/25

## 2020-09-24 ENCOUNTER — Encounter (HOSPITAL_COMMUNITY): Payer: 59 | Admitting: Physical Therapy

## 2020-09-29 ENCOUNTER — Encounter (HOSPITAL_COMMUNITY): Payer: Self-pay | Admitting: Physical Therapy

## 2020-09-29 ENCOUNTER — Other Ambulatory Visit: Payer: Self-pay

## 2020-09-29 ENCOUNTER — Ambulatory Visit (HOSPITAL_COMMUNITY): Payer: 59 | Admitting: Physical Therapy

## 2020-09-29 DIAGNOSIS — M545 Low back pain, unspecified: Secondary | ICD-10-CM

## 2020-09-29 DIAGNOSIS — M533 Sacrococcygeal disorders, not elsewhere classified: Secondary | ICD-10-CM

## 2020-09-29 NOTE — Patient Instructions (Signed)
Access Code: [email or web address] URL: [email or web address] Date: 09/29/2020 Prepared by: Josue Hector  Exercises Supine March - 2 x daily - 7 x weekly - 2 sets - 10 reps Supine Bridge - 2 x daily - 7 x weekly - 2 sets - 10 reps - 5 sec hold Straight Leg Raise - 2 x daily - 7 x weekly - 2 sets - 10 reps Sidelying Hip Abduction - 2 x daily - 7 x weekly - 2 sets - 10 reps Dead Bug - 2 x daily - 7 x weekly - 2 sets - 10 reps Prone Press Up - 2 x daily - 7 x weekly - 2 sets - 10 reps

## 2020-09-29 NOTE — Therapy (Signed)
[place], [place], [address] Phone: [phone]   Fax:  [phone]  Physical Therapy Treatment  Patient Details  Name: Ann Foley MRN: [id number] Date of Birth: 1966/04/12 Referring Provider (PT): Melina Schools MD    Encounter Date: 09/29/2020   PT End of Session - 09/29/20 1357    Visit Number 6    Number of Visits 8    Date for PT Re-Evaluation 10/03/20    Authorization Type [employer] (no auth, 60 VL, 50 remain)    Authorization - Visit Number 6    Authorization - Number of Visits 50    PT Start Time 1350    PT Stop Time 1430    PT Time Calculation (min) 40 min    Activity Tolerance Patient tolerated treatment well    Behavior During Therapy [facility] for tasks assessed/performed           Past Medical History:  Diagnosis Date  . Allergy   . Anxiety   . Asthma   . BV (bacterial vaginosis) 11/29/2013  . Contraceptive management 05/07/2015  . Hypertension   . Plantar fasciitis   . Thyroid disease   . Vaginal discharge 11/29/2013  . Vesicles 05/07/2015    Past Surgical History:  Procedure Laterality Date  . CARPAL TUNNEL RELEASE    . ELBOW ARTHROSCOPY Right   . SHOULDER ARTHROSCOPY Right   . THYROIDECTOMY    . WISDOM TOOTH EXTRACTION      There were no vitals filed for this visit.   Subjective Assessment - 09/29/20 1356    Subjective Patient says she's about a 3. Says she feels the press up are helpful. Says she had to stand for an hour on Friday which made her back hurt.    Currently in Pain? Yes    Pain Score 3     Pain Location Hip    Pain Orientation Left    Pain Descriptors / Indicators Aching    Pain Type Acute pain    Pain Onset More than a month ago    Pain Frequency Constant                             [facility] Adult PT Treatment/Exercise - 09/29/20 0001      Lumbar Exercises: Stretches   Prone on Elbows Stretch 2 reps;60 seconds    Press Ups 2  reps;10 reps      Lumbar Exercises: Supine   Ab Set 10 reps;5 seconds    Bent Knee Raise 20 reps    Dead Bug 15 reps    Bridge 15 reps;5 seconds    Straight Leg Raise 15 reps      Lumbar Exercises: Sidelying   Hip Abduction Both;15 reps      Knee/Hip Exercises: Standing   Hip Abduction 1 set;15 reps;Both    Abduction Limitations RTB 5" holds    Forward Step Up Both;1 set;15 reps;Hand Hold: 0;Step Height: 4"    Functional Squat 2 sets;10 reps    Functional Squat Limitations to chair for depth cues    Other Standing Knee Exercises band sidestepping RTB x 2RT                    PT Short Term Goals - 09/09/20 1703      PT SHORT TERM GOAL #1   Title Patient will be independent with initial HEP and self-management strategies  to improve functional outcomes    Time 2    Period Weeks    Status On-going    Target Date 09/19/20             PT Long Term Goals - 09/09/20 1703      PT LONG TERM GOAL #1   Title Patient will improve FOTO score by 10% to indicate improvement in functional outcomes    Time 4    Period Weeks    Status On-going      PT LONG TERM GOAL #2   Title Patient will report at least 75% overall improvement in subjective complaint to indicate improvement in ability to perform ADLs.    Time 4    Period Weeks    Status On-going      PT LONG TERM GOAL #3   Title Patient will improve lumbar AROM by 25% in all restricted planes for improved ability to perform functional mobility tasks and ADLs.    Time 4    Period Weeks    Status On-going      PT LONG TERM GOAL #4   Title Patient will be able to sit comfortably > 60 minutes to improve ability to perform work tasks.    Time 4    Period Weeks                 Plan - 09/29/20 1427    Clinical Impression Statement Patient tolerated session well overall today. Pain reported less pain today, but remains mostly unchanged throughout session. Patient able to perform all activity without significant  complaint, but did note some discomfort in LT posterior hip with band sidestepping to RT. Progressed hip and core strengthening exercise today. Patient educated on proper form and function of all added exercises. Issued update HEP handout. Patient cued to avoid painful end range with standing hip abduction. Patient will continue to benefit from skilled therapy services to progress hip and core strength to reduce pain and improve LOF with ADLs .    Examination-Activity Limitations Sit;Bend;Locomotion Level;Stand;Lift;Transfers    Examination-Participation Restrictions Cleaning;Yard Work;Community Activity;Occupation;Laundry    Stability/Clinical Decision Making Stable/Uncomplicated    Rehab Potential Good    PT Frequency 2x / week    PT Duration 4 weeks    PT Treatment/Interventions ADLs/Self Care Home Management;Aquatic Therapy;Biofeedback;Cryotherapy;Moist Heat;Fluidtherapy;Therapeutic activities;Traction;Patient/family education;Ultrasound;Parrafin;Functional mobility training;Neuromuscular re-education;Stair training;Orthotic Fit/Training;Manual techniques;Manual lymph drainage;Balance training;Therapeutic exercise;Contrast Bath;DME Instruction;Gait training;Electrical Stimulation;Iontophoresis 4mg /ml Dexamethasone;Compression bandaging;Scar mobilization;Vasopneumatic Device;Taping;Joint Manipulations;Vestibular;Visual/perceptual remediation/compensation;Passive range of motion;Dry needling;Energy conservation;Splinting;Spinal Manipulations    PT Next Visit Plan Assess response to updated HEP. Progress note next visit per end of cert.    PT Home Exercise Plan Eval: iso hip abd, iso hip add, bridge 09/22/20: PPU 09/29/20: bent knee raise, SLR, sidelying hip abdction, deadbugs    Consulted and Agree with Plan of Care Patient           Patient will benefit from skilled therapeutic intervention in order to improve the following deficits and impairments:  Pain,Improper body mechanics,Increased fascial  restricitons,Decreased activity tolerance,Decreased balance,Decreased strength,Decreased range of motion,Hypomobility,Impaired perceived functional ability,Postural dysfunction  Visit Diagnosis: Sacrococcygeal disorders, not elsewhere classified  Low back pain, unspecified back pain laterality, unspecified chronicity, unspecified whether sciatica present     Problem List Patient Active Problem List   Diagnosis Date Noted  . Right carpal tunnel syndrome 11/21/2019  . Arthritis of carpometacarpal [facility]) joint of right thumb 09/27/2019  . 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 Carpal tunnel syndrome, right  upper limb 03/27/2019  . Moody 07/19/2018  . Night sweats 07/19/2018  . Hot flashes due to menopause 07/19/2018  . Screening for colorectal cancer 07/19/2018  . Encounter for gynecological examination with Papanicolaou smear of cervix 07/19/2018  . History of HPV infection 07/19/2018  . Vesicles 05/07/2015  . Contraceptive management 05/07/2015  . Vaginal discharge 11/29/2013  . BV (bacterial vaginosis) 11/29/2013    2:33 PM, 09/29/20 Josue Hector PT DPT  Physical Therapist with [facility]  [phone] [place] [address] [place], [place], [address] Phone: [phone]   Fax:  [phone]  Name: Ann Foley MRN: [id number] Date of Birth: 1966-04-09

## 2020-10-01 ENCOUNTER — Ambulatory Visit (HOSPITAL_COMMUNITY): Payer: 59 | Admitting: Physical Therapy

## 2020-10-01 ENCOUNTER — Other Ambulatory Visit: Payer: Self-pay

## 2020-10-01 DIAGNOSIS — M533 Sacrococcygeal disorders, not elsewhere classified: Secondary | ICD-10-CM

## 2020-10-01 DIAGNOSIS — M545 Low back pain, unspecified: Secondary | ICD-10-CM

## 2020-10-01 NOTE — Therapy (Signed)
[facility] [address] [place], [place], [address] Phone: [phone]   Fax:  [phone]  Physical Therapy Treatment  Patient Details  Name: Ann Foley MRN: [id number] Date of Birth: December 06, 1965 Referring Provider (PT): Melina Schools MD   Progress Note Reporting Period 09/02/20 to 10/01/20  See note below for Objective Data and Assessment of Progress/Goals.      Encounter Date: 10/01/2020   PT End of Session - 10/01/20 1653    Visit Number 7    Number of Visits 15    Date for PT Re-Evaluation 10/31/20    Authorization Type [employer] (no auth, 60 VL, 50 remain)    Authorization - Visit Number 7    Authorization - Number of Visits 50    PT Start Time 5003    PT Stop Time 1727    PT Time Calculation (min) 40 min    Activity Tolerance Patient tolerated treatment well    Behavior During Therapy WFL for tasks assessed/performed           Past Medical History:  Diagnosis Date  . Allergy   . Anxiety   . Asthma   . BV (bacterial vaginosis) 11/29/2013  . Contraceptive management 05/07/2015  . Hypertension   . Plantar fasciitis   . Thyroid disease   . Vaginal discharge 11/29/2013  . Vesicles 05/07/2015    Past Surgical History:  Procedure Laterality Date  . CARPAL TUNNEL RELEASE    . ELBOW ARTHROSCOPY Right   . SHOULDER ARTHROSCOPY Right   . THYROIDECTOMY    . WISDOM TOOTH EXTRACTION      There were no vitals filed for this visit.   Subjective Assessment - 10/01/20 1651    Subjective Patient says her back feels about the same. Says she is doing well with home exercise and reports about 50% improvement since starting therapy.    Limitations Sitting    Patient Stated Goals be pain free    Currently in Pain? Yes    Pain Score 3     Pain Location Hip    Pain Orientation Left    Pain Descriptors / Indicators Aching    Pain Type Acute pain    Pain Onset More than a month ago    Pain Frequency Constant     Aggravating Factors  sitting/ standing for long periods    Pain Relieving Factors finding a comfortable position and sitting there    Effect of Pain on Daily Activities Limits              [facility] PT Assessment - 10/01/20 0001      Assessment   Medical Diagnosis LBP    Referring Provider (PT) Melina Schools MD     Prior Therapy Not for back       Precautions   Precautions None      Restrictions   Weight Bearing Restrictions No      [place] residence      Prior Function   Level of Independence Independent      Cognition   Overall Cognitive Status Within Functional Limits for tasks assessed      Observation/Other Assessments   Focus on Therapeutic Outcomes (FOTO)  33% limited   was 43% limited     AROM   Lumbar Flexion 50% limited   no change   Lumbar Extension 50% limited   was 75% limited   Lumbar - Right Side [facility]  Lumbar - Left Side Bend 10% limited    no change   Lumbar - Right Rotation WFL     Lumbar - Left Rotation WFL    slight discomfort                        [facility] Adult PT Treatment/Exercise - 10/01/20 0001      Lumbar Exercises: Machines for Strengthening   Other Lumbar Machine Exercise machine palloff press with straight bar 20# x10 each, machine palloff walkouts 30# x10 each      Lumbar Exercises: Standing   Heel Raises 20 reps    Functional Squats 20 reps    Functional Squats Limitations to chair for depth cues      Knee/Hip Exercises: Standing   Hip Abduction 1 set;Both;10 reps    Abduction Limitations RTB 5" holds    Other Standing Knee Exercises band sidestepping RTB x 3RT                    PT Short Term Goals - 10/01/20 1732      PT SHORT TERM GOAL #1   Title Patient will be independent with initial HEP and self-management strategies to improve functional outcomes    Baseline Reports compliance    Time 2    Period Weeks    Status Achieved    Target Date 09/19/20              PT Long Term Goals - 10/01/20 1732      PT LONG TERM GOAL #1   Title Patient will improve FOTO score by 10% to indicate improvement in functional outcomes    Baseline 10% improved    Time 4    Period Weeks    Status Achieved      PT LONG TERM GOAL #2   Title Patient will report at least 75% overall improvement in subjective complaint to indicate improvement in ability to perform ADLs.    Baseline Reports 50% improved    Time 4    Period Weeks    Status On-going      PT LONG TERM GOAL #3   Title Patient will improve lumbar AROM by 25% in all restricted planes for improved ability to perform functional mobility tasks and ADLs.    Baseline See AROM    Time 4    Period Weeks    Status Partially Met      PT LONG TERM GOAL #4   Title Patient will be able to sit comfortably > 60 minutes to improve ability to perform work tasks.    Baseline Reports ability to sit up to 1 hour    Time 4    Period Weeks    Status Achieved                 Plan - 10/01/20 1727    Clinical Impression Statement Patient shows steady progress toward therapy goals. Patient reports improved back pain and shows some improvement with activity tolerance. Patient continues to be limited by low level pain exacerbated by prolonged posturing and AROM restrictions which continue to negatively impact functional ability. Patient will continue to benefit from skilled therapy services to address remaining deficits to reduce pain and improve LOF with ADLs.    Examination-Activity Limitations Sit;Bend;Locomotion Level;Stand;Lift;Transfers    Examination-Participation Restrictions Cleaning;Yard Work;Community Activity;Occupation;Laundry    Stability/Clinical Decision Making Stable/Uncomplicated    Rehab Potential Good    PT Frequency 2x / week  PT Duration 4 weeks    PT Treatment/Interventions ADLs/Self Care Home Management;Aquatic Therapy;Biofeedback;Cryotherapy;Moist Heat;Fluidtherapy;Therapeutic  activities;Traction;Patient/family education;Ultrasound;Parrafin;Functional mobility training;Neuromuscular re-education;Stair training;Orthotic Fit/Training;Manual techniques;Manual lymph drainage;Balance training;Therapeutic exercise;Contrast Bath;DME Instruction;Gait training;Electrical Stimulation;Iontophoresis 70m/ml Dexamethasone;Compression bandaging;Scar mobilization;Vasopneumatic Device;Taping;Joint Manipulations;Vestibular;Visual/perceptual remediation/compensation;Passive range of motion;Dry needling;Energy conservation;Splinting;Spinal Manipulations    PT Next Visit Plan Continue with progressive hip and core strengthening for reduced pain and improved function. Add single limb balance    PT Home Exercise Plan Eval: iso hip abd, iso hip add, bridge 09/22/20: PPU 09/29/20: bent knee raise, SLR, sidelying hip abdction, deadbugs    Consulted and Agree with Plan of Care Patient           Patient will benefit from skilled therapeutic intervention in order to improve the following deficits and impairments:  Pain,Improper body mechanics,Increased fascial restricitons,Decreased activity tolerance,Decreased balance,Decreased strength,Decreased range of motion,Hypomobility,Impaired perceived functional ability,Postural dysfunction  Visit Diagnosis: Sacrococcygeal disorders, not elsewhere classified  Low back pain, unspecified back pain laterality, unspecified chronicity, unspecified whether sciatica present     Problem List Patient Active Problem List   Diagnosis Date Noted  . Right carpal tunnel syndrome 11/21/2019  . Arthritis of carpometacarpal ([facility] joint of right thumb 09/27/2019  . 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 Carpal tunnel syndrome, right upper limb 03/27/2019  . Moody 07/19/2018  . Night sweats 07/19/2018  . Hot flashes due to menopause 07/19/2018  . Screening for colorectal cancer 07/19/2018  . Encounter for gynecological examination with  Papanicolaou smear of cervix 07/19/2018  . History of HPV infection 07/19/2018  . Vesicles 05/07/2015  . Contraceptive management 05/07/2015  . Vaginal discharge 11/29/2013  . BV (bacterial vaginosis) 11/29/2013   5:34 PM, 10/01/20 CJosue HectorPT DPT  [facility] [phone] [address]Phone: [phone]  Fax:  [phone] Name: DCLAYTON JARMONMRN: [id number] of Birth: 1February 25, 1967

## 2020-10-07 ENCOUNTER — Other Ambulatory Visit: Payer: Self-pay

## 2020-10-07 ENCOUNTER — Ambulatory Visit (HOSPITAL_COMMUNITY): Payer: 59 | Admitting: Physical Therapy

## 2020-10-07 ENCOUNTER — Encounter (HOSPITAL_COMMUNITY): Payer: Self-pay | Admitting: Physical Therapy

## 2020-10-07 DIAGNOSIS — M533 Sacrococcygeal disorders, not elsewhere classified: Secondary | ICD-10-CM

## 2020-10-07 DIAGNOSIS — M545 Low back pain, unspecified: Secondary | ICD-10-CM

## 2020-10-07 NOTE — Therapy (Signed)
[facility] [place], [place], [address] Phone: [phone]   Fax:  [phone]  Physical Therapy Treatment  Patient Details  Name: Ann Foley MRN: [id number] Date of Birth: 1966-01-09 Referring Provider (PT): Melina Schools MD    Encounter Date: 10/07/2020   PT End of Session - 10/07/20 1449    Visit Number 8    Number of Visits 15    Date for PT Re-Evaluation 10/31/20    Authorization Type [employer] (no auth, 60 VL, 50 remain)    Authorization - Visit Number 8    Authorization - Number of Visits 50    PT Start Time 3009    PT Stop Time 1528    PT Time Calculation (min) 39 min    Activity Tolerance Patient tolerated treatment well    Behavior During Therapy [facility] for tasks assessed/performed           Past Medical History:  Diagnosis Date  . Allergy   . Anxiety   . Asthma   . BV (bacterial vaginosis) 11/29/2013  . Contraceptive management 05/07/2015  . Hypertension   . Plantar fasciitis   . Thyroid disease   . Vaginal discharge 11/29/2013  . Vesicles 05/07/2015    Past Surgical History:  Procedure Laterality Date  . CARPAL TUNNEL RELEASE    . ELBOW ARTHROSCOPY Right   . SHOULDER ARTHROSCOPY Right   . THYROIDECTOMY    . WISDOM TOOTH EXTRACTION      There were no vitals filed for this visit.   Subjective Assessment - 10/07/20 1450    Subjective Patient states she tries to 2-3x/day to see if more exercise helps. Pain continues to be worse with sitting. She has been using a wedge under legs which has been helping.    Limitations Sitting    Patient Stated Goals be pain free    Currently in Pain? Yes    Pain Score 3     Pain Location Hip    Pain Orientation Left    Pain Descriptors / Indicators Aching    Pain Onset More than a month ago                             [facility] Adult PT Treatment/Exercise - 10/07/20 0001      Lumbar Exercises: Machines for Strengthening   Other  Lumbar Machine Exercise machine palloff press with straight bar 20# x10 each, machine palloff walkouts 30# x10 each      Lumbar Exercises: Standing   Row 10 reps    Theraband Level (Row) Level 4 (Blue)    Row Limitations 2 sets    Shoulder Extension 10 reps    Theraband Level (Shoulder Extension) Level 4 (Blue)    Shoulder Extension Limitations 2 sets      Lumbar Exercises: Seated   Sit to Stand 10 reps    Sit to Stand Limitations 2 sets      Lumbar Exercises: Quadruped   Other Quadruped Lumbar Exercises quadruped hip extension 2x10 bilateral with cueing to limit lumbar rotation      Knee/Hip Exercises: Standing   SLS 2x30 seconds bilateral      Knee/Hip Exercises: Prone   Hip Extension Both;10 reps;2 sets      Manual Therapy   Manual Therapy Soft tissue mobilization    Manual therapy comments Manual complete separate than rest of tx    Soft tissue mobilization self  STM demonstration instruction and performance to L glute                  PT Education - 10/07/20 1449    Education Details [place], Exercise mechanics    Person(s) Educated Patient    Methods Explanation;Demonstration    Comprehension Verbalized understanding;Returned demonstration            PT Short Term Goals - 10/01/20 1732      PT SHORT TERM GOAL #1   Title Patient will be independent with initial HEP and self-management strategies to improve functional outcomes    Baseline Reports compliance    Time 2    Period Weeks    Status Achieved    Target Date 09/19/20             PT Long Term Goals - 10/01/20 1732      PT LONG TERM GOAL #1   Title Patient will improve FOTO score by 10% to indicate improvement in functional outcomes    Baseline 10% improved    Time 4    Period Weeks    Status Achieved      PT LONG TERM GOAL #2   Title Patient will report at least 75% overall improvement in subjective complaint to indicate improvement in ability to perform ADLs.    Baseline Reports 50%  improved    Time 4    Period Weeks    Status On-going      PT LONG TERM GOAL #3   Title Patient will improve lumbar AROM by 25% in all restricted planes for improved ability to perform functional mobility tasks and ADLs.    Baseline See AROM    Time 4    Period Weeks    Status Partially Met      PT LONG TERM GOAL #4   Title Patient will be able to sit comfortably > 60 minutes to improve ability to perform work tasks.    Baseline Reports ability to sit up to 1 hour    Time 4    Period Weeks    Status Achieved                 Plan - 10/07/20 1449    Clinical Impression Statement Patient given cueing for TRA and glute activation with all postural strengthening exercises with good carry over. Patient demonstrates good control with palof exercises and notes fatigue following. Patient instructed in self STM with tennis ball and is able to perform well following initial demonstration. Patient requires min verbal cueing for limiting excessive lumbar ROM with hip extension exercises with good carryover. Patient with very good stability with SLS with intermittent HHA for balance. Patient will continue to benefit from skilled physical therapy in order to reduce impairment and improve function.    Examination-Activity Limitations Sit;Bend;Locomotion Level;Stand;Lift;Transfers    Examination-Participation Restrictions Cleaning;Yard Work;Community Activity;Occupation;Laundry    Stability/Clinical Decision Making Stable/Uncomplicated    Rehab Potential Good    PT Frequency 2x / week    PT Duration 4 weeks    PT Treatment/Interventions ADLs/Self Care Home Management;Aquatic Therapy;Biofeedback;Cryotherapy;Moist Heat;Fluidtherapy;Therapeutic activities;Traction;Patient/family education;Ultrasound;Parrafin;Functional mobility training;Neuromuscular re-education;Stair training;Orthotic Fit/Training;Manual techniques;Manual lymph drainage;Balance training;Therapeutic exercise;Contrast Bath;DME  Instruction;Gait training;Electrical Stimulation;Iontophoresis 4mg /ml Dexamethasone;Compression bandaging;Scar mobilization;Vasopneumatic Device;Taping;Joint Manipulations;Vestibular;Visual/perceptual remediation/compensation;Passive range of motion;Dry needling;Energy conservation;Splinting;Spinal Manipulations    PT Next Visit Plan Continue with progressive hip and core strengthening for reduced pain and improved function.    PT Home Exercise Plan Eval: iso hip abd, iso hip add, bridge 09/22/20: PPU  09/29/20: bent knee raise, SLR, sidelying hip abdction, deadbugs    Consulted and Agree with Plan of Care Patient           Patient will benefit from skilled therapeutic intervention in order to improve the following deficits and impairments:  Pain,Improper body mechanics,Increased fascial restricitons,Decreased activity tolerance,Decreased balance,Decreased strength,Decreased range of motion,Hypomobility,Impaired perceived functional ability,Postural dysfunction  Visit Diagnosis: Sacrococcygeal disorders, not elsewhere classified  Low back pain, unspecified back pain laterality, unspecified chronicity, unspecified whether sciatica present     Problem List Patient Active Problem List   Diagnosis Date Noted  . Right carpal tunnel syndrome 11/21/2019  . Arthritis of carpometacarpal [facility]) joint of right thumb 09/27/2019  . 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 Carpal tunnel syndrome, right upper limb 03/27/2019  . Moody 07/19/2018  . Night sweats 07/19/2018  . Hot flashes due to menopause 07/19/2018  . Screening for colorectal cancer 07/19/2018  . Encounter for gynecological examination with Papanicolaou smear of cervix 07/19/2018  . History of HPV infection 07/19/2018  . Vesicles 05/07/2015  . Contraceptive management 05/07/2015  . Vaginal discharge 11/29/2013  . BV (bacterial vaginosis) 11/29/2013    3:28 PM, 10/07/20 Mearl Latin PT,  DPT Physical Therapist at [facility] [place], [place], [address] Phone: [phone]   Fax:  [phone]  Name: Ann Foley MRN: [id number] Date of Birth: Apr 22, 1966

## 2020-10-07 NOTE — Patient Instructions (Signed)
Access Code: [email or web address] URL: [email or web address] Date: 10/07/2020 Prepared by: Greig Castilla Tereka Thorley  Exercises Prone Hip Extension - 1 x daily - 7 x weekly - 2 sets - 10 reps

## 2020-10-09 ENCOUNTER — Encounter (HOSPITAL_COMMUNITY): Payer: Self-pay | Admitting: Physical Therapy

## 2020-10-09 ENCOUNTER — Ambulatory Visit (HOSPITAL_COMMUNITY): Payer: 59 | Admitting: Physical Therapy

## 2020-10-09 ENCOUNTER — Other Ambulatory Visit: Payer: Self-pay

## 2020-10-09 DIAGNOSIS — M533 Sacrococcygeal disorders, not elsewhere classified: Secondary | ICD-10-CM | POA: Diagnosis not present

## 2020-10-09 DIAGNOSIS — M545 Low back pain, unspecified: Secondary | ICD-10-CM

## 2020-10-09 NOTE — Therapy (Signed)
[facility] [place], [place], [address] Phone: [phone]   Fax:  [phone]  Physical Therapy Treatment  Patient Details  Name: Ann Foley MRN: [id number] Date of Birth: 10/16/1965 Referring Provider (PT): Melina Schools MD    Encounter Date: 10/09/2020   PT End of Session - 10/09/20 1526    Visit Number 9    Number of Visits 15    Date for PT Re-Evaluation 10/31/20    Authorization Type [employer] (no auth, 60 VL, 50 remain)    Authorization - Visit Number 9    Authorization - Number of Visits 50    PT Start Time 1520    PT Stop Time 1605    PT Time Calculation (min) 45 min    Activity Tolerance Patient tolerated treatment well    Behavior During Therapy [facility] for tasks assessed/performed           Past Medical History:  Diagnosis Date   Allergy    Anxiety    Asthma    BV (bacterial vaginosis) 11/29/2013   Contraceptive management 05/07/2015   Hypertension    Plantar fasciitis    Thyroid disease    Vaginal discharge 11/29/2013   Vesicles 05/07/2015    Past Surgical History:  Procedure Laterality Date   CARPAL TUNNEL RELEASE     ELBOW ARTHROSCOPY Right    SHOULDER ARTHROSCOPY Right    THYROIDECTOMY     WISDOM TOOTH EXTRACTION      There were no vitals filed for this visit.   Subjective Assessment - 10/09/20 1525    Subjective Patient says she feels slightly better. Says she has been doing exercise more often which she feels has helped.    Limitations Sitting    Patient Stated Goals be pain free    Currently in Pain? Yes    Pain Score 2     Pain Location Hip    Pain Orientation Left;Posterior    Pain Descriptors / Indicators Aching    Pain Type Acute pain    Pain Onset More than a month ago    Pain Frequency Constant                             [facility] Adult PT Treatment/Exercise - 10/09/20 0001      Lumbar Exercises: Stretches   Double Knee to  Chest Stretch 5 reps;10 seconds      Lumbar Exercises: Standing   Heel Raises 20 reps    Row 20 reps;Theraband    Theraband Level (Row) Level 3 (Green)    Shoulder Extension 20 reps;Theraband    Theraband Level (Shoulder Extension) Level 3 (Green)    Other Standing Lumbar Exercises Palloff press GTB x 20 each, palloff walkouts GTB x 10 each      Lumbar Exercises: Supine   Bridge 10 reps    Bridge Limitations alternating single leg knee extension      Knee/Hip Exercises: Aerobic   Elliptical 3 min EOS for mobility      Knee/Hip Exercises: Standing   Heel Raises Both;2 sets;10 reps    Hip Abduction Both;2 sets;10 reps    Abduction Limitations GTB 5"    Functional Squat 2 sets;10 reps    Functional Squat Limitations GTB around knees    Other Standing Knee Exercises band monster walks GTB x 3RT  PT Short Term Goals - 10/01/20 1732      PT SHORT TERM GOAL #1   Title Patient will be independent with initial HEP and self-management strategies to improve functional outcomes    Baseline Reports compliance    Time 2    Period Weeks    Status Achieved    Target Date 09/19/20             PT Long Term Goals - 10/01/20 1732      PT LONG TERM GOAL #1   Title Patient will improve FOTO score by 10% to indicate improvement in functional outcomes    Baseline 10% improved    Time 4    Period Weeks    Status Achieved      PT LONG TERM GOAL #2   Title Patient will report at least 75% overall improvement in subjective complaint to indicate improvement in ability to perform ADLs.    Baseline Reports 50% improved    Time 4    Period Weeks    Status On-going      PT LONG TERM GOAL #3   Title Patient will improve lumbar AROM by 25% in all restricted planes for improved ability to perform functional mobility tasks and ADLs.    Baseline See AROM    Time 4    Period Weeks    Status Partially Met      PT LONG TERM GOAL #4   Title Patient will be able to  sit comfortably > 60 minutes to improve ability to perform work tasks.    Baseline Reports ability to sit up to 1 hour    Time 4    Period Weeks    Status Achieved                 Plan - 10/09/20 1731    Clinical Impression Statement Patient tolerated ther ex progressions well today. Progressed LE strength with added monster walks and increasing to green band with hip abductions. Added single leg bridging for glute and core activation. Patient required verbal cues for hip extension and LE positioning. Patient able to tolerate mobility work on elliptical but does note slight discomfort. Patient reporting baseline pain level at end of session. Patient educated on single leg bridge progression and trial of DKTC stretching for HEP additions. Patient will continue to benefit from skilled therapy services to progress hip and core strength to reduce pain and improve LOF with ADLs.    Examination-Activity Limitations Sit;Bend;Locomotion Level;Stand;Lift;Transfers    Examination-Participation Restrictions Cleaning;Yard Work;Community Activity;Occupation;Laundry    Stability/Clinical Decision Making Stable/Uncomplicated    Rehab Potential Good    PT Frequency 2x / week    PT Duration 4 weeks    PT Treatment/Interventions ADLs/Self Care Home Management;Aquatic Therapy;Biofeedback;Cryotherapy;Moist Heat;Fluidtherapy;Therapeutic activities;Traction;Patient/family education;Ultrasound;Parrafin;Functional mobility training;Neuromuscular re-education;Stair training;Orthotic Fit/Training;Manual techniques;Manual lymph drainage;Balance training;Therapeutic exercise;Contrast Bath;DME Instruction;Gait training;Electrical Stimulation;Iontophoresis 4mg /ml Dexamethasone;Compression bandaging;Scar mobilization;Vasopneumatic Device;Taping;Joint Manipulations;Vestibular;Visual/perceptual remediation/compensation;Passive range of motion;Dry needling;Energy conservation;Splinting;Spinal Manipulations    PT Next Visit  Plan Continue with progressive hip and core strengthening for reduced pain and improved function.    PT Home Exercise Plan Eval: iso hip abd, iso hip add, bridge 09/22/20: PPU 09/29/20: bent knee raise, SLR, sidelying hip abdction, deadbugs 09/29/20: single leg bridge    Consulted and Agree with Plan of Care Patient           Patient will benefit from skilled therapeutic intervention in order to improve the following deficits and impairments:  Pain,Improper body mechanics,Increased fascial restricitons,Decreased activity  tolerance,Decreased balance,Decreased strength,Decreased range of motion,Hypomobility,Impaired perceived functional ability,Postural dysfunction  Visit Diagnosis: Sacrococcygeal disorders, not elsewhere classified  Low back pain, unspecified back pain laterality, unspecified chronicity, unspecified whether sciatica present     Problem List Patient Active Problem List   Diagnosis Date Noted   Right carpal tunnel syndrome 11/21/2019   Arthritis of carpometacarpal ([facility]) joint of right thumb 09/27/2019   Ulnar impingement syndrome of right upper extremity 03/27/2019   Carpal tunnel syndrome, right upper limb 03/27/2019   Moody 07/19/2018   Night sweats 07/19/2018   Hot flashes due to menopause 07/19/2018   Screening for colorectal cancer 07/19/2018   Encounter for gynecological examination with Papanicolaou smear of cervix 07/19/2018   History of HPV infection 07/19/2018   Vesicles 05/07/2015   Contraceptive management 05/07/2015   Vaginal discharge 11/29/2013   BV (bacterial vaginosis) 11/29/2013    5:33 PM, 10/09/20 Josue Hector PT DPT  Physical Therapist with [facility]  [phone] [place] [address] [place], [place], [address] Phone: [phone]   Fax:  [phone]  Name: SOFI BRYARS MRN: [id number] Date of Birth: Aug 28, 1966

## 2020-10-14 ENCOUNTER — Ambulatory Visit (HOSPITAL_COMMUNITY): Payer: 59 | Attending: Orthopedic Surgery | Admitting: Physical Therapy

## 2020-10-14 ENCOUNTER — Encounter (HOSPITAL_COMMUNITY): Payer: Self-pay | Admitting: Physical Therapy

## 2020-10-14 ENCOUNTER — Telehealth (HOSPITAL_COMMUNITY): Payer: Self-pay

## 2020-10-14 ENCOUNTER — Other Ambulatory Visit: Payer: Self-pay

## 2020-10-14 DIAGNOSIS — M545 Low back pain, unspecified: Secondary | ICD-10-CM

## 2020-10-14 DIAGNOSIS — M533 Sacrococcygeal disorders, not elsewhere classified: Secondary | ICD-10-CM | POA: Diagnosis not present

## 2020-10-14 NOTE — Telephone Encounter (Signed)
Called and left message asking if pt can come in at 5:30 vs 5:45 later today.  Included contact information.  Becky Sax, LPTA/CLT; [place] [phone]

## 2020-10-14 NOTE — Therapy (Signed)
[place], [place], [address] Phone: [phone]   Fax:  [phone]  Physical Therapy Treatment  Patient Details  Name: Ann Foley MRN: [id number] Date of Birth: 1966/10/03 Referring Provider (PT): Melina Schools MD    Encounter Date: 10/14/2020   PT End of Session - 10/14/20 1742    Visit Number 10    Number of Visits 15    Date for PT Re-Evaluation 10/31/20    Authorization Type [employer] (no auth, 60 VL, 50 remain)    Authorization - Visit Number 10    Authorization - Number of Visits 50    PT Start Time 6962    PT Stop Time 1810    PT Time Calculation (min) 38 min    Activity Tolerance Patient tolerated treatment well    Behavior During Therapy [facility] for tasks assessed/performed           Past Medical History:  Diagnosis Date  . Allergy   . Anxiety   . Asthma   . BV (bacterial vaginosis) 11/29/2013  . Contraceptive management 05/07/2015  . Hypertension   . Plantar fasciitis   . Thyroid disease   . Vaginal discharge 11/29/2013  . Vesicles 05/07/2015    Past Surgical History:  Procedure Laterality Date  . CARPAL TUNNEL RELEASE    . ELBOW ARTHROSCOPY Right   . SHOULDER ARTHROSCOPY Right   . THYROIDECTOMY    . WISDOM TOOTH EXTRACTION      There were no vitals filed for this visit.   Subjective Assessment - 10/14/20 1741    Subjective Pateitn says things are going well, feels exercises are helping. Pain is about a 2, felt a good stretch with DKTC    Limitations Sitting    Patient Stated Goals be pain free    Currently in Pain? Yes    Pain Score 2     Pain Location Hip    Pain Orientation Left;Posterior    Pain Descriptors / Indicators Aching    Pain Type Acute pain    Pain Onset More than a month ago    Pain Frequency Constant                             [facility] Adult PT Treatment/Exercise - 10/14/20 0001      Lumbar Exercises: Stretches   Double Knee to  Chest Stretch 5 reps;10 seconds    Figure 4 Stretch 5 reps;10 seconds      Lumbar Exercises: Aerobic   Recumbent Bike 4 min dynamic warmup      Lumbar Exercises: Machines for Strengthening   Other Lumbar Machine Exercise machine walkout out 40# x10    Other Lumbar Machine Exercise machine deadlifts 2 x 10 (40, 50#)      Lumbar Exercises: Standing   Other Standing Lumbar Exercises Palloff press GTB x 20 each, palloff walkouts GTB x 10 each    Other Standing Lumbar Exercises band lifts and chops GTB x 20 each      Knee/Hip Exercises: Standing   Functional Squat 2 sets;10 reps    Functional Squat Limitations GTB around knees yellow med ball hold    Other Standing Knee Exercises band monster walks GTB x 3RT, sidestepping 3RT GTB                    PT Short Term Goals - 10/01/20 1732  PT SHORT TERM GOAL #1   Title Patient will be independent with initial HEP and self-management strategies to improve functional outcomes    Baseline Reports compliance    Time 2    Period Weeks    Status Achieved    Target Date 09/19/20             PT Long Term Goals - 10/01/20 1732      PT LONG TERM GOAL #1   Title Patient will improve FOTO score by 10% to indicate improvement in functional outcomes    Baseline 10% improved    Time 4    Period Weeks    Status Achieved      PT LONG TERM GOAL #2   Title Patient will report at least 75% overall improvement in subjective complaint to indicate improvement in ability to perform ADLs.    Baseline Reports 50% improved    Time 4    Period Weeks    Status On-going      PT LONG TERM GOAL #3   Title Patient will improve lumbar AROM by 25% in all restricted planes for improved ability to perform functional mobility tasks and ADLs.    Baseline See AROM    Time 4    Period Weeks    Status Partially Met      PT LONG TERM GOAL #4   Title Patient will be able to sit comfortably > 60 minutes to improve ability to perform work tasks.     Baseline Reports ability to sit up to 1 hour    Time 4    Period Weeks    Status Achieved                 Plan - 10/14/20 1813    Clinical Impression Statement Patient showing good progress toward therapy goals and with good tolerance to today's activity progressions. Added band lifts and chops for core strength progressions. Added machine deadlifts for hip and core strengthening. Patient required verbal cues for neutral spine and body mechanics with added deadlifts, but was able to perform with no increased back pain. Patient reports no pain at rest in seated on standing position at end of session. Still notes about 3/10 with lumbar extension in standing, but notes this has improved since starting therapy and is no longer sharp pain. Patient will continue to benefit from skilled therapy services to progress hip and core strength to reduce pain and improve LOF with ADLs.    Examination-Activity Limitations Sit;Bend;Locomotion Level;Stand;Lift;Transfers    Examination-Participation Restrictions Cleaning;Yard Work;Community Activity;Occupation;Laundry    Stability/Clinical Decision Making Stable/Uncomplicated    Rehab Potential Good    PT Frequency 2x / week    PT Duration 4 weeks    PT Treatment/Interventions ADLs/Self Care Home Management;Aquatic Therapy;Biofeedback;Cryotherapy;Moist Heat;Fluidtherapy;Therapeutic activities;Traction;Patient/family education;Ultrasound;Parrafin;Functional mobility training;Neuromuscular re-education;Stair training;Orthotic Fit/Training;Manual techniques;Manual lymph drainage;Balance training;Therapeutic exercise;Contrast Bath;DME Instruction;Gait training;Electrical Stimulation;Iontophoresis 4mg /ml Dexamethasone;Compression bandaging;Scar mobilization;Vasopneumatic Device;Taping;Joint Manipulations;Vestibular;Visual/perceptual remediation/compensation;Passive range of motion;Dry needling;Energy conservation;Splinting;Spinal Manipulations    PT Next Visit  Plan Continue with progressive hip and core strengthening for reduced pain and improved function. Add high box power up next visit    PT Home Exercise Plan Eval: iso hip abd, iso hip add, bridge 09/22/20: PPU 09/29/20: bent knee raise, SLR, sidelying hip abdction, deadbugs 09/29/20: single leg bridge 10/15/19: band rows, extension, palloff press    Consulted and Agree with Plan of Care Patient           Patient will benefit from skilled therapeutic intervention  in order to improve the following deficits and impairments:  Pain,Improper body mechanics,Increased fascial restricitons,Decreased activity tolerance,Decreased balance,Decreased strength,Decreased range of motion,Hypomobility,Impaired perceived functional ability,Postural dysfunction  Visit Diagnosis: Sacrococcygeal disorders, not elsewhere classified  Low back pain, unspecified back pain laterality, unspecified chronicity, unspecified whether sciatica present     Problem List Patient Active Problem List   Diagnosis Date Noted  . Right carpal tunnel syndrome 11/21/2019  . Arthritis of carpometacarpal [facility]) joint of right thumb 09/27/2019  . 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 Carpal tunnel syndrome, right upper limb 03/27/2019  . Moody 07/19/2018  . Night sweats 07/19/2018  . Hot flashes due to menopause 07/19/2018  . Screening for colorectal cancer 07/19/2018  . Encounter for gynecological examination with Papanicolaou smear of cervix 07/19/2018  . History of HPV infection 07/19/2018  . Vesicles 05/07/2015  . Contraceptive management 05/07/2015  . Vaginal discharge 11/29/2013  . BV (bacterial vaginosis) 11/29/2013    6:19 PM, 10/14/20 Josue Hector PT DPT  Physical Therapist with [facility]  [phone] [place], [place], [address] Phone: [phone]   Fax:  [phone]  Name: Ann Foley MRN: [id number] Date of Birth: July 23, 1966

## 2020-10-16 ENCOUNTER — Encounter (HOSPITAL_COMMUNITY): Payer: Self-pay

## 2020-10-16 ENCOUNTER — Ambulatory Visit (HOSPITAL_COMMUNITY): Payer: 59

## 2020-10-16 ENCOUNTER — Other Ambulatory Visit: Payer: Self-pay

## 2020-10-16 DIAGNOSIS — M533 Sacrococcygeal disorders, not elsewhere classified: Secondary | ICD-10-CM | POA: Diagnosis not present

## 2020-10-16 DIAGNOSIS — M545 Low back pain, unspecified: Secondary | ICD-10-CM

## 2020-10-16 NOTE — Therapy (Signed)
[place], [place], [address] Phone: [phone]   Fax:  [phone]  Physical Therapy Treatment  Patient Details  Name: Ann Foley MRN: [id number] Date of Birth: 07-27-1966 Referring Provider (PT): Melina Schools MD    Encounter Date: 10/16/2020   PT End of Session - 10/16/20 1721    Visit Number 11    Number of Visits 15    Date for PT Re-Evaluation 10/31/20    Authorization Type [employer] (no auth, 60 VL, 50 remain)    Authorization - Visit Number 11    Authorization - Number of Visits 50    PT Start Time 0109    PT Stop Time 1744    PT Time Calculation (min) 41 min    Activity Tolerance Patient tolerated treatment well    Behavior During Therapy [facility] for tasks assessed/performed           Past Medical History:  Diagnosis Date  . Allergy   . Anxiety   . Asthma   . BV (bacterial vaginosis) 11/29/2013  . Contraceptive management 05/07/2015  . Hypertension   . Plantar fasciitis   . Thyroid disease   . Vaginal discharge 11/29/2013  . Vesicles 05/07/2015    Past Surgical History:  Procedure Laterality Date  . CARPAL TUNNEL RELEASE    . ELBOW ARTHROSCOPY Right   . SHOULDER ARTHROSCOPY Right   . THYROIDECTOMY    . WISDOM TOOTH EXTRACTION      There were no vitals filed for this visit.   Subjective Assessment - 10/16/20 1706    Subjective Pt stated she thinks she over did it with HEP, increased pain today.  Pain is 3/10 achey pain today, reports higher than usual pain spot.    Patient Stated Goals be pain free    Currently in Pain? Yes    Pain Score 3     Pain Location Back    Pain Orientation Lower    Pain Descriptors / Indicators Aching;Dull    Pain Type Acute pain    Pain Onset More than a month ago    Pain Frequency Constant    Aggravating Factors  Sitting/standing for long periods    Pain Relieving Factors finding a comfortable position and sitting theres    Effect of Pain on  Daily Activities Limits                             [facility] Adult PT Treatment/Exercise - 10/16/20 0001      Lumbar Exercises: Stretches   Double Knee to Chest Stretch 3 reps;30 seconds    Lower Trunk Rotation 5 reps;10 seconds    Lower Trunk Rotation Limitations in DKTC position      Lumbar Exercises: Aerobic   Recumbent Bike 3 min dynamic warmup      Lumbar Exercises: Machines for Strengthening   Other Lumbar Machine Exercise machine walkout out 40# x10    Other Lumbar Machine Exercise machine deadlifts 2 x 10 (40, 50#)      Lumbar Exercises: Standing   Shoulder Extension 10 reps;Theraband    Theraband Level (Shoulder Extension) Level 3 (Green)    Shoulder Extension Limitations marching wiht scapular retraction    Other Standing Lumbar Exercises Palloff press GTB on foam tandem stance 4x 15    Other Standing Lumbar Exercises band lifts and chops GTB x 20 each      Knee/Hip Exercises: Standing  Forward Step Up 10 reps;Both;Hand Hold: 0;Step Height: 8"    Forward Step Up Limitations power up, core engangement    Functional Squat 2 sets;10 reps    Functional Squat Limitations GTB around knees yellow med ball hold    Other Standing Knee Exercises band monster walks GTB x 3RT, sidestepping 3RT GTB                    PT Short Term Goals - 10/01/20 1732      PT SHORT TERM GOAL #1   Title Patient will be independent with initial HEP and self-management strategies to improve functional outcomes    Baseline Reports compliance    Time 2    Period Weeks    Status Achieved    Target Date 09/19/20             PT Long Term Goals - 10/01/20 1732      PT LONG TERM GOAL #1   Title Patient will improve FOTO score by 10% to indicate improvement in functional outcomes    Baseline 10% improved    Time 4    Period Weeks    Status Achieved      PT LONG TERM GOAL #2   Title Patient will report at least 75% overall improvement in subjective complaint to  indicate improvement in ability to perform ADLs.    Baseline Reports 50% improved    Time 4    Period Weeks    Status On-going      PT LONG TERM GOAL #3   Title Patient will improve lumbar AROM by 25% in all restricted planes for improved ability to perform functional mobility tasks and ADLs.    Baseline See AROM    Time 4    Period Weeks    Status Partially Met      PT LONG TERM GOAL #4   Title Patient will be able to sit comfortably > 60 minutes to improve ability to perform work tasks.    Baseline Reports ability to sit up to 1 hour    Time 4    Period Weeks    Status Achieved                 Plan - 10/16/20 1836    Clinical Impression Statement Pt progressing well toward POC and good tolerance with today's activities.  Added exercises for core and proximal strengthening, pt able to complete with no reports of pain through session.  Verbal cueng and demonstration with deadlifts for core engangement, body mechanics and keeping neutral spine.  Pt limited with fatigue required some seated rest breaks.    Examination-Activity Limitations Sit;Bend;Locomotion Level;Stand;Lift;Transfers    Examination-Participation Restrictions Cleaning;Yard Work;Community Activity;Occupation;Laundry    Stability/Clinical Decision Making Stable/Uncomplicated    Clinical Decision Making Low    Rehab Potential Good    PT Frequency 2x / week    PT Duration 4 weeks    PT Treatment/Interventions ADLs/Self Care Home Management;Aquatic Therapy;Biofeedback;Cryotherapy;Moist Heat;Fluidtherapy;Therapeutic activities;Traction;Patient/family education;Ultrasound;Parrafin;Functional mobility training;Neuromuscular re-education;Stair training;Orthotic Fit/Training;Manual techniques;Manual lymph drainage;Balance training;Therapeutic exercise;Contrast Bath;DME Instruction;Gait training;Electrical Stimulation;Iontophoresis 4mg /ml Dexamethasone;Compression bandaging;Scar mobilization;Vasopneumatic Device;Taping;Joint  Manipulations;Vestibular;Visual/perceptual remediation/compensation;Passive range of motion;Dry needling;Energy conservation;Splinting;Spinal Manipulations    PT Next Visit Plan Continue with progressive hip and core strengthening for reduced pain and improved function.    PT Home Exercise Plan Eval: iso hip abd, iso hip add, bridge 09/22/20: PPU 09/29/20: bent knee raise, SLR, sidelying hip abdction, deadbugs 09/29/20: single leg bridge 10/15/19: band rows, extension, palloff press  Patient will benefit from skilled therapeutic intervention in order to improve the following deficits and impairments:  Pain,Improper body mechanics,Increased fascial restricitons,Decreased activity tolerance,Decreased balance,Decreased strength,Decreased range of motion,Hypomobility,Impaired perceived functional ability,Postural dysfunction  Visit Diagnosis: Sacrococcygeal disorders, not elsewhere classified  Low back pain, unspecified back pain laterality, unspecified chronicity, unspecified whether sciatica present     Problem List Patient Active Problem List   Diagnosis Date Noted  . Right carpal tunnel syndrome 11/21/2019  . Arthritis of carpometacarpal [facility]) joint of right thumb 09/27/2019  . 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 Carpal tunnel syndrome, right upper limb 03/27/2019  . Moody 07/19/2018  . Night sweats 07/19/2018  . Hot flashes due to menopause 07/19/2018  . Screening for colorectal cancer 07/19/2018  . Encounter for gynecological examination with Papanicolaou smear of cervix 07/19/2018  . History of HPV infection 07/19/2018  . Vesicles 05/07/2015  . Contraceptive management 05/07/2015  . Vaginal discharge 11/29/2013  . BV (bacterial vaginosis) 11/29/2013   Ihor Austin, LPTA/CLT; CBIS [phone]  Aldona Lento 10/16/2020, 6:46 PM  [place] [address] [place], [place],  [address] Phone: [phone]   Fax:  [phone]  Name: JENAVI BEEDLE MRN: [id number] Date of Birth: October 25, 1965

## 2020-10-21 ENCOUNTER — Other Ambulatory Visit: Payer: Self-pay

## 2020-10-21 ENCOUNTER — Encounter (HOSPITAL_COMMUNITY): Payer: Self-pay | Admitting: Physical Therapy

## 2020-10-21 ENCOUNTER — Ambulatory Visit (HOSPITAL_COMMUNITY): Payer: 59 | Admitting: Physical Therapy

## 2020-10-21 DIAGNOSIS — M533 Sacrococcygeal disorders, not elsewhere classified: Secondary | ICD-10-CM

## 2020-10-21 DIAGNOSIS — M545 Low back pain, unspecified: Secondary | ICD-10-CM

## 2020-10-22 NOTE — Therapy (Signed)
[place], [place], [address] Phone: [phone]   Fax:  [phone]  Physical Therapy Treatment  Patient Details  Name: Ann Foley MRN: [id number] Date of Birth: June 10, 1966 Referring Provider (PT): Melina Schools MD    Encounter Date: 10/21/2020   PT End of Session - 10/21/20 1739    Visit Number 12    Number of Visits 15    Date for PT Re-Evaluation 10/31/20    Authorization Type [employer] (no auth, 60 VL, 50 remain)    Authorization - Visit Number 12    Authorization - Number of Visits 50    PT Start Time 6962    PT Stop Time 1815    PT Time Calculation (min) 45 min    Activity Tolerance Patient tolerated treatment well    Behavior During Therapy [facility] for tasks assessed/performed           Past Medical History:  Diagnosis Date  . Allergy   . Anxiety   . Asthma   . BV (bacterial vaginosis) 11/29/2013  . Contraceptive management 05/07/2015  . Hypertension   . Plantar fasciitis   . Thyroid disease   . Vaginal discharge 11/29/2013  . Vesicles 05/07/2015    Past Surgical History:  Procedure Laterality Date  . CARPAL TUNNEL RELEASE    . ELBOW ARTHROSCOPY Right   . SHOULDER ARTHROSCOPY Right   . THYROIDECTOMY    . WISDOM TOOTH EXTRACTION      There were no vitals filed for this visit.   Subjective Assessment - 10/21/20 1738    Subjective Patient reports no new issues. Still having about 2/10 pain. HEP going well.    Patient Stated Goals be pain free    Currently in Pain? Yes    Pain Score 2     Pain Location Back    Pain Orientation Lower    Pain Descriptors / Indicators Aching    Pain Type Acute pain    Pain Onset More than a month ago    Pain Frequency Constant                10/21/20 0001  Lumbar Exercises: Aerobic  Recumbent Bike 4 min dynamic warmup  Lumbar Exercises: Standing  Other Standing Lumbar Exercises band lifts and chops GTB x 20 each  Lumbar Exercises:  Sidelying  Other Sidelying Lumbar Exercises modified side planks (from knees) 3 x 15"  Lumbar Exercises: Quadruped  Other Quadruped Lumbar Exercises birddogs x10  Knee/Hip Exercises: Stretches  Press photographer Both;3 reps;30 seconds  Knee/Hip Exercises: Standing  Other Standing Knee Exercises band monster walks GTB x 3RT, sidestepping 3RT GTB  Manual Therapy  Manual Therapy Soft tissue mobilization  Manual therapy comments Manual complete separate than rest of tx  Soft tissue mobilization IASTM using theragun with firm tip, lv 9 about LT SI sulcus, postior hip with patient in prone      PT Short Term Goals - 10/01/20 1732      PT SHORT TERM GOAL #1   Title Patient will be independent with initial HEP and self-management strategies to improve functional outcomes    Baseline Reports compliance    Time 2    Period Weeks    Status Achieved    Target Date 09/19/20             PT Long Term Goals - 10/01/20 1732      PT LONG TERM GOAL #1   Title Patient  will improve FOTO score by 10% to indicate improvement in functional outcomes    Baseline 10% improved    Time 4    Period Weeks    Status Achieved      PT LONG TERM GOAL #2   Title Patient will report at least 75% overall improvement in subjective complaint to indicate improvement in ability to perform ADLs.    Baseline Reports 50% improved    Time 4    Period Weeks    Status On-going      PT LONG TERM GOAL #3   Title Patient will improve lumbar AROM by 25% in all restricted planes for improved ability to perform functional mobility tasks and ADLs.    Baseline See AROM    Time 4    Period Weeks    Status Partially Met      PT LONG TERM GOAL #4   Title Patient will be able to sit comfortably > 60 minutes to improve ability to perform work tasks.    Baseline Reports ability to sit up to 1 hour    Time 4    Period Weeks    Status Achieved                 Plan - 10/22/20 1629    Clinical Impression Statement  Patient tolerated session well today. Added side planks and birddogs for core strength progressions. Issued patient green resistance band for HEP progressions. Patient educated on proper form and function to all added exercise and required verbal cued for positioning during side planks and birddogs. Added manual IASTM using theragun at end of session to address fascial restriction and pain about posterior LT hip. Patient reports reduction of pain post session to 0/10. Will assess response at following session.    Examination-Activity Limitations Sit;Bend;Locomotion Level;Stand;Lift;Transfers    Examination-Participation Restrictions Cleaning;Yard Work;Community Activity;Occupation;Laundry    Stability/Clinical Decision Making Stable/Uncomplicated    Rehab Potential Good    PT Frequency 2x / week    PT Duration 4 weeks    PT Treatment/Interventions ADLs/Self Care Home Management;Aquatic Therapy;Biofeedback;Cryotherapy;Moist Heat;Fluidtherapy;Therapeutic activities;Traction;Patient/family education;Ultrasound;Parrafin;Functional mobility training;Neuromuscular re-education;Stair training;Orthotic Fit/Training;Manual techniques;Manual lymph drainage;Balance training;Therapeutic exercise;Contrast Bath;DME Instruction;Gait training;Electrical Stimulation;Iontophoresis 9m/ml Dexamethasone;Compression bandaging;Scar mobilization;Vasopneumatic Device;Taping;Joint Manipulations;Vestibular;Visual/perceptual remediation/compensation;Passive range of motion;Dry needling;Energy conservation;Splinting;Spinal Manipulations    PT Next Visit Plan Assess reposnse to manual. Transiton to HEP in next 1-2 visit.    PT Home Exercise Plan Eval: iso hip abd, iso hip add, bridge 09/22/20: PPU 09/29/20: bent knee raise, SLR, sidelying hip abdction, deadbugs 09/29/20: single leg bridge 10/15/19: band rows, extension, palloff press 10/22/20: side planks, birddogs    Consulted and Agree with Plan of Care Patient           Patient  will benefit from skilled therapeutic intervention in order to improve the following deficits and impairments:  Pain,Improper body mechanics,Increased fascial restricitons,Decreased activity tolerance,Decreased balance,Decreased strength,Decreased range of motion,Hypomobility,Impaired perceived functional ability,Postural dysfunction  Visit Diagnosis: Sacrococcygeal disorders, not elsewhere classified  Low back pain, unspecified back pain laterality, unspecified chronicity, unspecified whether sciatica present     Problem List Patient Active Problem List   Diagnosis Date Noted  . Right carpal tunnel syndrome 11/21/2019  . Arthritis of carpometacarpal ([facility] joint of right thumb 09/27/2019  . 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 Carpal tunnel syndrome, right upper limb 03/27/2019  . Moody 07/19/2018  . Night sweats 07/19/2018  . Hot flashes due to menopause 07/19/2018  . Screening for colorectal cancer 07/19/2018  .  Encounter for gynecological examination with Papanicolaou smear of cervix 07/19/2018  . History of HPV infection 07/19/2018  . Vesicles 05/07/2015  . Contraceptive management 05/07/2015  . Vaginal discharge 11/29/2013  . BV (bacterial vaginosis) 11/29/2013   4:36 PM, 10/22/20 Josue Hector PT DPT  Physical Therapist with [facility]  [phone] [address] [place], [place], [address] Phone: [phone]   Fax:  [phone]  Name: Ann Foley MRN: [id number] Date of Birth: 29-Oct-1965

## 2020-10-22 NOTE — Progress Notes (Signed)
   10/21/20 0001  Lumbar Exercises: Aerobic  Recumbent Bike 4 min dynamic warmup  Lumbar Exercises: Standing  Other Standing Lumbar Exercises band lifts and chops GTB x 20 each  Lumbar Exercises: Sidelying  Other Sidelying Lumbar Exercises modified side planks (from knees) 3 x 15"  Lumbar Exercises: Quadruped  Other Quadruped Lumbar Exercises birddogs x10  Knee/Hip Exercises: Stretches  Press photographer Both;3 reps;30 seconds  Knee/Hip Exercises: Standing  Other Standing Knee Exercises band monster walks GTB x 3RT, sidestepping 3RT GTB  Manual Therapy  Manual Therapy Soft tissue mobilization  Manual therapy comments Manual complete separate than rest of tx  Soft tissue mobilization IASTM using theragun with firm tip, lv 9 about LT SI sulcus, postior hip with patient in prone

## 2020-10-23 ENCOUNTER — Encounter (HOSPITAL_COMMUNITY): Payer: Self-pay | Admitting: Physical Therapy

## 2020-10-23 ENCOUNTER — Ambulatory Visit (HOSPITAL_COMMUNITY): Payer: 59 | Admitting: Physical Therapy

## 2020-10-23 ENCOUNTER — Other Ambulatory Visit: Payer: Self-pay

## 2020-10-23 DIAGNOSIS — M533 Sacrococcygeal disorders, not elsewhere classified: Secondary | ICD-10-CM

## 2020-10-23 DIAGNOSIS — M545 Low back pain, unspecified: Secondary | ICD-10-CM

## 2020-10-23 NOTE — Therapy (Signed)
[facility] [address] [place], [place], [address] Phone: [phone]   Fax:  [phone]  Physical Therapy Treatment  Patient Details  Name: Ann Foley MRN: [id number] Date of Birth: [birth year] Referring Provider (PT): Melina Schools MD   PHYSICAL THERAPY DISCHARGE SUMMARY  Visits from Start of Care: 13  Current functional level related to goals / functional outcomes: See below    Remaining deficits: See below     Education / Equipment: See assessment  Plan: Patient agrees to discharge.  Patient goals were met. Patient is being discharged due to meeting the stated rehab goals.  ?????      Encounter Date: 10/23/2020   PT End of Session - 10/23/20 1746    Visit Number 13    Number of Visits 15    Date for PT Re-Evaluation 10/31/20    Authorization Type [employer] (no auth, 60 VL, 50 remain)    Authorization - Visit Number 13    Authorization - Number of Visits 50    PT Start Time 9518    PT Stop Time 1818    PT Time Calculation (min) 46 min    Activity Tolerance Patient tolerated treatment well    Behavior During Therapy WFL for tasks assessed/performed           Past Medical History:  Diagnosis Date  . Allergy   . Anxiety   . Asthma   . BV (bacterial vaginosis) 11/29/2013  . Contraceptive management 05/07/2015  . Hypertension   . Plantar fasciitis   . Thyroid disease   . Vaginal discharge 11/29/2013  . Vesicles 05/07/2015    Past Surgical History:  Procedure Laterality Date  . CARPAL TUNNEL RELEASE    . ELBOW ARTHROSCOPY Right   . SHOULDER ARTHROSCOPY Right   . THYROIDECTOMY    . WISDOM TOOTH EXTRACTION      There were no vitals filed for this visit.   Subjective Assessment - 10/23/20 1744    Subjective Patient says she was a little sore after manual last visit. She says she thinks it helped some and that she did not have as much pain as usual the following day. She says her pain is about  the same today "about a 2".    Patient Stated Goals be pain free    Currently in Pain? Yes    Pain Score 2     Pain Location Back    Pain Orientation Lower    Pain Descriptors / Indicators Aching    Pain Type Acute pain    Pain Onset More than a month ago    Pain Frequency Intermittent              [facility] PT Assessment - 10/23/20 0001      Assessment   Medical Diagnosis LBP    Referring Provider (PT) Melina Schools MD     Prior Therapy Not for back       Precautions   Precautions None      Restrictions   Weight Bearing Restrictions No      [place] residence    Living Arrangements Alone      Prior Function   Level of Independence Independent      Cognition   Overall Cognitive Status Within Functional Limits for tasks assessed      Observation/Other Assessments   Focus on Therapeutic Outcomes (FOTO)  6% limited   wsa 33% limited  AROM   Lumbar Flexion 10% limited   was 50%   Lumbar Extension 10% limited   was 50%   Lumbar - Right Side Bend WFL    Lumbar - Left Side Bend WFL   was 10% limited   Lumbar - Right Rotation WFL     Lumbar - Left Rotation [facility]                          [facility] Adult PT Treatment/Exercise - 10/23/20 0001      Lumbar Exercises: Aerobic   Elliptical 4 min dynamic warmup      Lumbar Exercises: Standing   Other Standing Lumbar Exercises band lifts and chops GTB x 20 each      Lumbar Exercises: Supine   Dead Bug 20 reps      Lumbar Exercises: Sidelying   Other Sidelying Lumbar Exercises modified side planks (from knees) 2 x 30"      Lumbar Exercises: Prone   Other Prone Lumbar Exercises modified planks 2 x 30"      Lumbar Exercises: Quadruped   Other Quadruped Lumbar Exercises birddogs x10                    PT Short Term Goals - 10/01/20 1732      PT SHORT TERM GOAL #1   Title Patient will be independent with initial HEP and self-management strategies to improve  functional outcomes    Baseline Reports compliance    Time 2    Period Weeks    Status Achieved    Target Date 09/19/20             PT Long Term Goals - 10/23/20 1818      PT LONG TERM GOAL #1   Title Patient will improve FOTO score by 10% to indicate improvement in functional outcomes    Baseline 37% improved    Time 4    Period Weeks    Status Achieved      PT LONG TERM GOAL #2   Title Patient will report at least 75% overall improvement in subjective complaint to indicate improvement in ability to perform ADLs.    Baseline Reports 85% improved    Time 4    Period Weeks    Status Achieved      PT LONG TERM GOAL #3   Title Patient will improve lumbar AROM by 25% in all restricted planes for improved ability to perform functional mobility tasks and ADLs.    Baseline See AROM    Time 4    Period Weeks    Status Achieved      PT LONG TERM GOAL #4   Title Patient will be able to sit comfortably > 60 minutes to improve ability to perform work tasks.    Baseline Reports ability to sit 2-3 hours    Time 4    Period Weeks    Status Achieved                 Plan - 10/23/20 1828    Clinical Impression Statement Patient has progressed well toward therapy goals and has currently met all short and long term goals. Patient reports significant subjective improvement, decreased pain and shows improved strength and lumbar AROM. Patient remains limited by intermittent low-level pain with prolonged positions but improves with exercise. At this time, patient will be DC from therapy to transition to HEP. Reviewed and educated patient on comprehensive  HEP and issued updated handout. Answered all patient questions. Patient instructed to follow up with therapy services with any further questions or concerns.    Examination-Activity Limitations Sit;Bend;Locomotion Level;Stand;Lift;Transfers    Examination-Participation Restrictions Cleaning;Yard Work;Community  Activity;Occupation;Laundry    Stability/Clinical Decision Making Stable/Uncomplicated    Rehab Potential Good    PT Frequency 2x / week    PT Duration 4 weeks    PT Treatment/Interventions ADLs/Self Care Home Management;Aquatic Therapy;Biofeedback;Cryotherapy;Moist Heat;Fluidtherapy;Therapeutic activities;Traction;Patient/family education;Ultrasound;Parrafin;Functional mobility training;Neuromuscular re-education;Stair training;Orthotic Fit/Training;Manual techniques;Manual lymph drainage;Balance training;Therapeutic exercise;Contrast Bath;DME Instruction;Gait training;Electrical Stimulation;Iontophoresis 4mg /ml Dexamethasone;Compression bandaging;Scar mobilization;Vasopneumatic Device;Taping;Joint Manipulations;Vestibular;Visual/perceptual remediation/compensation;Passive range of motion;Dry needling;Energy conservation;Splinting;Spinal Manipulations    PT Next Visit Plan DC to HEP    PT Home Exercise Plan Eval: iso hip abd, iso hip add, bridge 09/22/20: PPU 09/29/20: bent knee raise, SLR, sidelying hip abdction, deadbugs 09/29/20: single leg bridge 10/15/19: band rows, extension, palloff press 10/22/20: side planks, birddogs    Consulted and Agree with Plan of Care Patient           Patient will benefit from skilled therapeutic intervention in order to improve the following deficits and impairments:  Pain,Improper body mechanics,Increased fascial restricitons,Decreased activity tolerance,Decreased balance,Decreased strength,Decreased range of motion,Hypomobility,Impaired perceived functional ability,Postural dysfunction  Visit Diagnosis: Sacrococcygeal disorders, not elsewhere classified  Low back pain, unspecified back pain laterality, unspecified chronicity, unspecified whether sciatica present     Problem List Patient Active Problem List   Diagnosis Date Noted  . Right carpal tunnel syndrome 11/21/2019  . Arthritis of carpometacarpal [facility]) joint of right thumb 09/27/2019  . Ulnar  impingement syndrome of right upper extremity 03/27/2019  . Carpal tunnel syndrome, right upper limb 03/27/2019  . [place] 07/19/2018  . Night sweats 07/19/2018  . Hot flashes due to menopause 07/19/2018  . Screening for colorectal cancer 07/19/2018  . Encounter for gynecological examination with Papanicolaou smear of cervix 07/19/2018  . History of HPV infection 07/19/2018  . Vesicles 05/07/2015  . Contraceptive management 05/07/2015  . Vaginal discharge 11/29/2013  . BV (bacterial vaginosis) 11/29/2013    6:33 PM, 10/23/20 Josue Hector PT DPT  Physical Therapist with [facility]  [phone] [place], [place], [address] Phone: [phone]   Fax:  [phone]  Name: Ann Foley MRN: [id number] Date of Birth: [birth year]

## 2020-10-23 NOTE — Patient Instructions (Signed)
Access Code: [facility] URL: [email or web address] Date: 10/23/2020 Prepared by: Josue Hector  Exercises Supine Transversus Abdominis Bracing - Hands on Stomach - 1 x daily - 3 x weekly - 1 sets - 10 reps - 5 second hold Supine March - 1 x daily - 3 x weekly - 2 sets - 10 reps Supine Dead Bug with Leg Extension - 1 x daily - 3 x weekly - 2 sets - 10 reps Supine Bridge - 1 x daily - 3 x weekly - 2 sets - 10 reps - 5 second hold Side Plank on Knees - 1 x daily - 3 x weekly - 1 sets - 5 reps - 30 second hold Plank on Knees - 1 x daily - 3 x weekly - 1 sets - 5 reps - 30 second hold Bird Dog - 1 x daily - 3 x weekly - 2 sets - 10 reps Standing Row with Anchored Resistance - 1 x daily - 3 x weekly - 2 sets - 10 reps Shoulder Extension with Resistance - 1 x daily - 3 x weekly - 2 sets - 10 reps Side Stepping with Resistance at Ankles - 1 x daily - 3 x weekly - 1 sets - 10 reps Forward Monster Walks - 1 x daily - 3 x weekly - 1 sets - 10 reps Standing Diagonal Lift with Anchored Resistance - 1 x daily - 3 x weekly - 2 sets - 10 reps Standing Diagonal Chop - 1 x daily - 3 x weekly - 2 sets - 10 reps

## 2020-10-28 ENCOUNTER — Encounter (HOSPITAL_COMMUNITY): Payer: 59

## 2020-10-30 ENCOUNTER — Encounter (HOSPITAL_COMMUNITY): Payer: 59 | Admitting: Physical Therapy

## 2021-05-20 ENCOUNTER — Other Ambulatory Visit (HOSPITAL_COMMUNITY): Payer: Self-pay | Admitting: Adult Health

## 2021-05-20 DIAGNOSIS — Z1231 Encounter for screening mammogram for malignant neoplasm of breast: Secondary | ICD-10-CM

## 2021-05-27 ENCOUNTER — Other Ambulatory Visit: Payer: Self-pay

## 2021-05-27 ENCOUNTER — Ambulatory Visit (HOSPITAL_COMMUNITY)
Admission: RE | Admit: 2021-05-27 | Discharge: 2021-05-27 | Disposition: A | Discharge status: Home / Self Care | Payer: 59 | Source: Ambulatory Visit | Attending: Adult Health | Admitting: Adult Health

## 2021-05-27 DIAGNOSIS — Z1231 Encounter for screening mammogram for malignant neoplasm of breast: Secondary | ICD-10-CM | POA: Insufficient documentation

## 2021-10-13 ENCOUNTER — Encounter (HOSPITAL_COMMUNITY): Payer: Self-pay

## 2021-10-13 ENCOUNTER — Other Ambulatory Visit: Payer: Self-pay

## 2021-10-13 ENCOUNTER — Emergency Department (HOSPITAL_COMMUNITY)
Admission: EM | Admit: 2021-10-13 | Discharge: 2021-10-13 | Disposition: A | Discharge status: Home / Self Care | Payer: 59 | Attending: Emergency Medicine | Admitting: Emergency Medicine

## 2021-10-13 DIAGNOSIS — H538 Other visual disturbances: Secondary | ICD-10-CM | POA: Diagnosis present

## 2021-10-13 DIAGNOSIS — Z79899 Other long term (current) drug therapy: Secondary | ICD-10-CM | POA: Insufficient documentation

## 2021-10-13 DIAGNOSIS — H1131 Conjunctival hemorrhage, right eye: Secondary | ICD-10-CM

## 2021-10-13 MED ORDER — TETRACAINE HCL 0.5 % OP SOLN
2.0000 [drp] | Freq: Once | OPHTHALMIC | Status: AC
  Administered 2021-10-13: 2 [drp] via OPHTHALMIC
  Filled 2021-10-13: qty 4

## 2021-10-13 MED ORDER — FLUORESCEIN SODIUM 1 MG OP STRP
1.0000 | ORAL_STRIP | Freq: Once | OPHTHALMIC | Status: AC
  Administered 2021-10-13: 1 via OPHTHALMIC
  Filled 2021-10-13: qty 1

## 2021-10-13 NOTE — ED Triage Notes (Signed)
Pt states she dropped tweezers into right eye this morning

## 2021-10-13 NOTE — ED Provider Notes (Signed)
[facility] EMERGENCY DEPARTMENT Provider Note   CSN: [id number] Arrival date & time: 10/13/21  0530     History  Chief Complaint  Patient presents with   Eye Pain    Ann Foley is a 56 y.o. female.  She is here with a complaint of right eye injury.  She said she was tweezing some eyebrows when she dropped the tweezers into her eye and had some bleeding on the lateral side of the right sclera.  She has some discomfort with blinking.  No blurriness double vision.  No other complaints.  Not on blood thinners.  The history is provided by the patient.  Eye Pain This is a new problem. The current episode started 1 to 2 hours ago. The problem occurs constantly. The problem has not changed since onset.Pertinent negatives include no chest pain, no abdominal pain, no headaches and no shortness of breath. Nothing aggravates the symptoms. Nothing relieves the symptoms. She has tried nothing for the symptoms. The treatment provided no relief.      Home Medications Prior to Admission medications   Medication Sig Start Date End Date Taking? Authorizing Provider  ADVAIR DISKUS 250-50 MCG/DOSE AEPB Inhale 1 puff into the lungs 2 (two) times daily.  08/07/13   [provider]  Ascorbic Acid (VITAMIN C) 1000 MG tablet Take 1,000 mg by mouth daily.    [provider]  buPROPion (WELLBUTRIN SR) 150 MG 12 hr tablet Take 150 mg by mouth 2 (two) times daily.  09/27/13   [provider]  escitalopram (LEXAPRO) 20 MG tablet Take 20 mg by mouth daily.  07/04/13   [provider]  estradiol-levonorgestrel [facility]) 0.045-0.015 MG/DAY Place 1 patch onto the skin once a week. 07/19/18   Estill Dooms, NP  ketorolac (TORADOL) 10 MG tablet Take 1 tablet (10 mg total) by mouth 2 (two) times daily as needed. 12/21/19   Leandrew Koyanagi, MD  levocetirizine (XYZAL) 5 MG tablet Take 5 mg by mouth every evening.  09/25/13   [provider]  losartan-hydrochlorothiazide  (HYZAAR) 50-12.5 MG per tablet Take 1 tablet by mouth daily.  09/27/13   [provider]  omeprazole (PRILOSEC) 20 MG capsule  11/03/16   [provider]  ondansetron (ZOFRAN) 4 MG tablet Take 1 tablet (4 mg total) by mouth every 8 (eight) hours as needed for nausea or vomiting. 12/19/19   Aundra Dubin, PA-C  oxyCODONE-acetaminophen (PERCOCET) 5-325 MG tablet Take 1-2 tablets by mouth every 6 (six) hours as needed for severe pain. 12/19/19   Aundra Dubin, PA-C  PROAIR HFA 108 (90 BASE) MCG/ACT inhaler Inhale 1 puff into the lungs as needed.  11/19/13   [provider]  SYNTHROID 175 MCG tablet Take 175 mcg by mouth daily before breakfast.  09/10/13   [provider]  zolpidem (AMBIEN) 10 MG tablet Take 10 mg by mouth at bedtime as needed.  09/25/13   [provider]      Allergies    Patient has no known allergies.    Review of Systems   Review of Systems  Constitutional:  Negative for fever.  Eyes:  Positive for pain. Negative for visual disturbance.  Respiratory:  Negative for shortness of breath.   Cardiovascular:  Negative for chest pain.  Gastrointestinal:  Negative for abdominal pain.  Neurological:  Negative for headaches.   Physical Exam Updated Vital Signs BP (!) 136/92    Pulse 70    Temp 98.2 F (  36.8 C) (Oral)    Resp 18    Ht 5\' 5"  (1.651 m)    Wt 82.1 kg    LMP 05/25/2017 (Approximate)    SpO2 96%    BMI 30.12 kg/m  Physical Exam Constitutional:      Appearance: Normal appearance. She is well-developed.  HENT:     Head: Normocephalic and atraumatic.     Nose: Nose normal.     Mouth/Throat:     Mouth: Mucous membranes are moist.     Pharynx: Oropharynx is clear.  Eyes:     General: Lids are normal. Vision grossly intact. Gaze aligned appropriately.     Extraocular Movements: Extraocular movements intact.     Conjunctiva/sclera:     Right eye: Hemorrhage present.     Pupils: Pupils are equal, round, and reactive to  light.     Comments: She has approximately 1 cm area of hemorrhage on the right lateral scleral area.  Anterior chamber clear and quiet.  No hyphema.  Pupil equal round reactive to light.  No fluorescein uptake.  No Seidel sign.  Cardiovascular:     Rate and Rhythm: Normal rate and regular rhythm.  Pulmonary:     Effort: Pulmonary effort is normal.     Breath sounds: Normal breath sounds.  Musculoskeletal:     Cervical back: Neck supple.  Skin:    General: Skin is warm and dry.  Neurological:     Mental Status: She is alert.     GCS: GCS eye subscore is 4. GCS verbal subscore is 5. GCS motor subscore is 6.    ED Results / Procedures / Treatments   Labs (all labs ordered are listed, but only abnormal results are displayed) Labs Reviewed - No data to display  EKG None  Radiology No results found.  Procedures Procedures    Medications Ordered in ED Medications  tetracaine (PONTOCAINE) 0.5 % ophthalmic solution 2 drop (has no administration in time range)  fluorescein ophthalmic strip 1 strip (has no administration in time range)    ED Course/ Medical Decision Making/ A&P                           Medical Decision Making  56 year old female here with right eye trauma and hemorrhage.  Fluorescein not showing any Seidel sign.  Doubt globe perforation.  No other corneal abrasion.  Reassured patient of findings.  Clear return instructions discussed.        Final Clinical Impression(s) / ED Diagnoses Final diagnoses:  Conjunctival hemorrhage of right eye    Rx / DC Orders ED Discharge Orders     None         Hayden Rasmussen, MD 10/13/21 [phone]

## 2021-11-05 ENCOUNTER — Ambulatory Visit: Payer: 59 | Admitting: Orthopaedic Surgery

## 2021-12-31 ENCOUNTER — Encounter: Payer: Self-pay | Admitting: Gastroenterology

## 2022-01-12 ENCOUNTER — Ambulatory Visit: Payer: 59 | Admitting: Gastroenterology

## 2022-01-12 ENCOUNTER — Encounter: Payer: Self-pay | Admitting: *Deleted

## 2022-01-12 VITALS — BP 124/80 | HR 72 | Ht 64.0 in | Wt 186.1 lb

## 2022-01-12 DIAGNOSIS — R195 Other fecal abnormalities: Secondary | ICD-10-CM | POA: Diagnosis not present

## 2022-01-12 DIAGNOSIS — K219 Gastro-esophageal reflux disease without esophagitis: Secondary | ICD-10-CM | POA: Diagnosis not present

## 2022-01-12 MED ORDER — SUTAB 1479-225-188 MG PO TABS: ORAL_TABLET | ORAL | 0 refills | Status: DC

## 2022-01-12 NOTE — Patient Instructions (Signed)
You have been scheduled for an endoscopy and colonoscopy. Please follow the written instructions given to you at your visit today. ?Please pick up your prep supplies at the pharmacy within the next 1-3 days. ?If you use inhalers (even only as needed), please bring them with you on the day of your procedure. ? ?We have sent the following medications to your pharmacy for you to pick up at your convenience: ?Omeprazole 40 mg twice daily ? ?If you are age 58 or older, your body mass index should be between 23-30. Your Body mass index is 31.95 kg/m?Marland Kitchen If this is out of the aforementioned range listed, please consider follow up with your Primary Care Provider. ? ?If you are age 84 or younger, your body mass index should be between 19-25. Your Body mass index is 31.95 kg/m?Marland Kitchen If this is out of the aformentioned range listed, please consider follow up with your Primary Care Provider.  ? ?________________________________________________________ ? ?The [facility] providers would like to encourage you to use [facility] to communicate with providers for non-urgent requests or questions.  Due to long hold times on the telephone, sending your provider a message by [facility] may be a faster and more efficient way to get a response.  Please allow 48 business hours for a response.  Please remember that this is for non-urgent requests.  ?_______________________________________________________ ? ?Due to recent changes in healthcare laws, you may see the results of your imaging and laboratory studies on MyChart before your provider has had a chance to review them.  We understand that in some cases there may be results that are confusing or concerning to you. Not all laboratory results come back in the same time frame and the provider may be waiting for multiple results in order to interpret others.  Please give [place] 48 hours in order for your provider to thoroughly review all the results before contacting the office for clarification of your  results.  ? ?

## 2022-01-12 NOTE — Progress Notes (Signed)
? ? ? ?01/12/2022 ?Ann Foley ?[id number] ?07-06-1966 ? ? ?HISTORY OF PRESENT ILLNESS: This is a 56 year old female who is new to our practice.  She is here today at the referral of her PCP, Dr. Stephanie Acre, for evaluation regarding a positive fecal immunochemistry test.  This was performed and found to be positive back in November 2022.  Hemoglobin normal at 12.5 g at that time.  She had a colonoscopy in 2006 by Dr. Collene Mares for indication of diarrhea.  She had sigmoid diverticulosis at that time, but the study was otherwise normal and random colon biopsies were normal.  She also had an EGD performed and found to have a small hiatal hernia with mild antral gastritis.  Duodenal biopsies were negative for H. pylori.  She tells me now she is moving her bowels well.  She has not seen any dark or bloody stools.  She does state that she has been on omeprazole 20 mg daily for quite some time and feels like it does not control her reflux well.  She feels like it is wears off throughout the day.  She denies abdominal pain or dysphagia. ? ? ?Past Medical History:  ?Diagnosis Date  ? Allergy   ? Anxiety   ? Asthma   ? BV (bacterial vaginosis) 11/29/2013  ? Contraceptive management 05/07/2015  ? Depression   ? Diverticulosis   ? Hiatal hernia 2006  ? small; Dr Verdia Kuba  ? Hyperlipidemia   ? Hypertension   ? Hypothyroidism   ? Plantar fasciitis   ? Restless leg syndrome   ? Thyroid disease   ? Vaginal discharge 11/29/2013  ? Vesicles 05/07/2015  ? Vitamin D deficiency   ? ?Past Surgical History:  ?Procedure Laterality Date  ? CARPAL TUNNEL RELEASE Bilateral   ? ELBOW ARTHROSCOPY Right   ? SHOULDER ARTHROSCOPY Right   ? THYROIDECTOMY    ? WISDOM TOOTH EXTRACTION    ? ? reports that she has never smoked. She has never used smokeless tobacco. She reports that she does not drink alcohol and does not use drugs. ?family history includes Asthma in her paternal aunt; Heart disease in her paternal grandfather and paternal grandmother;  Hyperlipidemia in her sister; Hypertension in her father, mother, and sister; Hypothyroidism in her mother. ?Allergies  ?Allergen Reactions  ? Shellfish-Derived Products Anaphylaxis  ? ? ?  ?Outpatient Encounter Medications as of 01/12/2022  ?Medication Sig  ? ADVAIR DISKUS 250-50 MCG/DOSE AEPB Inhale 1 puff into the lungs 2 (two) times daily.   ? Ascorbic Acid (VITAMIN C) 1000 MG tablet Take 1,000 mg by mouth daily.  ? buPROPion (WELLBUTRIN SR) 150 MG 12 hr tablet Take 150 mg by mouth 2 (two) times daily.   ? escitalopram (LEXAPRO) 20 MG tablet Take 20 mg by mouth daily.   ? levocetirizine (XYZAL) 5 MG tablet Take 5 mg by mouth every evening.   ? losartan-hydrochlorothiazide (HYZAAR) 50-12.5 MG per tablet Take 1 tablet by mouth daily.   ? omeprazole (PRILOSEC) 20 MG capsule Take 20 mg by mouth daily.  ? PROAIR HFA 108 (90 BASE) MCG/ACT inhaler Inhale 1 puff into the lungs as needed.   ? SYNTHROID 175 MCG tablet Take 175 mcg by mouth daily before breakfast.   ? zolpidem (AMBIEN) 10 MG tablet Take 10 mg by mouth at bedtime as needed.   ? [DISCONTINUED] estradiol-levonorgestrel (CLIMARAPRO) 0.045-0.015 MG/DAY Place 1 patch onto the skin once a week.  ? [DISCONTINUED] ketorolac (TORADOL) 10 MG tablet Take 1 tablet (10  mg total) by mouth 2 (two) times daily as needed.  ? [DISCONTINUED] ondansetron (ZOFRAN) 4 MG tablet Take 1 tablet (4 mg total) by mouth every 8 (eight) hours as needed for nausea or vomiting.  ? [DISCONTINUED] oxyCODONE-acetaminophen (PERCOCET) 5-325 MG tablet Take 1-2 tablets by mouth every 6 (six) hours as needed for severe pain.  ? ?No facility-administered encounter medications on file as of 01/12/2022.  ? ? ? ?REVIEW OF SYSTEMS  : All other systems reviewed and negative except where noted in the History of Present Illness. ? ? ?PHYSICAL EXAM: ?BP 124/80 (BP Location: Left Arm, Patient Position: Sitting, Cuff Size: Normal)   Pulse 72   Ht '5\' 4"'$  (1.626 m)   Wt 186 lb 2 oz (84.4 kg)   LMP 05/25/2017  (Approximate)   BMI 31.95 kg/m?  ?General: Well developed white female in no acute distress ?Head: Normocephalic and atraumatic ?Eyes:  Sclerae anicteric, conjunctiva pink. ?Ears: Normal auditory acuity ?Lungs: Clear throughout to auscultation; no W/R/R. ?Heart: Regular rate and rhythm; no M/R/G. ?Abdomen: Soft, non-distended.  BS present.  Non-tender. ?Rectal:  Will be done at the time of colonoscopy. ?Musculoskeletal: Symmetrical with no gross deformities  ?Skin: No lesions on visible extremities ?Extremities: No edema  ?Neurological: Alert oriented x 4, grossly non-focal ?Psychological:  Alert and cooperative. Normal mood and affect ? ?ASSESSMENT AND PLAN: ?*Positive fecal immunochemistry: Does not see any black or bloody stools.  Last colonoscopy was in 2006 by Dr. Collene Mares.  Hemoglobin 12.5 g.  We will plan for colonoscopy with Dr. Havery Moros. ?*GERD: Has been on omeprazole 20 mg OTC daily for several years.  Feels like symptoms suboptimally controlled for the past several years.  Had EGD in 2006.  We will plan for EGD with Dr. Havery Moros as well.  I am going to increase her PPI to 40 mg twice daily for now.  Prescription sent to pharmacy. ? ?**The risks, benefits, and alternatives to EGD and colonoscopy were discussed with the patient and she consents to proceed.  ? ? ?CC:  Jonathon Jordan, MD ? ?  ?

## 2022-01-13 NOTE — Progress Notes (Signed)
Agree with assessment and plan as outlined.  

## 2022-01-18 ENCOUNTER — Ambulatory Visit: Payer: 59 | Admitting: Orthopedic Surgery

## 2022-01-18 ENCOUNTER — Ambulatory Visit: Payer: 59

## 2022-01-18 VITALS — BP 136/98 | HR 87 | Ht 64.0 in | Wt 185.0 lb

## 2022-01-18 DIAGNOSIS — M545 Low back pain, unspecified: Secondary | ICD-10-CM | POA: Diagnosis not present

## 2022-01-18 NOTE — Patient Instructions (Signed)
SCHEDULE MRI LUMBAR  ?

## 2022-01-18 NOTE — Progress Notes (Signed)
Chief Complaint  ?Patient presents with  ? Back Pain  ?  Back pain, no injury.  ? ? ?HPI: 56 year old female comes in complaining of lower back pain on the left side.  This does not affect her in terms of hip pain leg pain numbness or weakness.  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 ?She is already been treated with therapy acupuncture deep tissue massage chiropractic manipulation she takes Tylenol and ibuprofen she saw orthopedics once in the past they sent her for the therapy it did not work ? ?Past Medical History:  ?Diagnosis Date  ? Allergy   ? Anxiety   ? Asthma   ? BV (bacterial vaginosis) 11/29/2013  ? Contraceptive management 05/07/2015  ? Depression   ? Diverticulosis   ? Hiatal hernia 2006  ? small; Dr Verdia Kuba  ? Hyperlipidemia   ? Hypertension   ? Hypothyroidism   ? Plantar fasciitis   ? Restless leg syndrome   ? Thyroid disease   ? Vaginal discharge 11/29/2013  ? Vesicles 05/07/2015  ? Vitamin D deficiency   ? ? ?BP (!) 136/98   Pulse 87   Ht '5\' 4"'$  (1.626 m)   Wt 185 lb (83.9 kg)   LMP 05/25/2017 (Approximate)   BMI 31.76 kg/m?  ? ? ?General appearance: Well-developed well-nourished no gross deformities ? ?Cardiovascular normal pulse and perfusion normal color without edema ? ?Neurologically no sensation loss or deficits or pathologic reflexes ? ?Psychological: Awake alert and oriented x3 mood and affect normal ? ?Skin no lacerations or ulcerations no nodularity no palpable masses, no erythema or nodularity ? ?Musculoskeletal: Her heel and toe walking are normal her hip range of motion is normal bilaterally with no pain ? ?She has tenderness on the left lower part of her back no midline tenderness no tenderness on the right ? ?Imaging her spinal imaging shows slight coronal plane malalignment is very little even scoliosis by criteria ? ?She has hyperlordosis with facet arthritis L4-S1 she has anterior lipping of L1-2 and 3 Summit T12 as well.  No disc space narrowing spondylolysis or  listhesis ? ?This looks like arthritis of the spine with increased lumbar lordosis ? ?A/P ? ?Based on her 2-year history of back pain appropriate medical management abnormal x-rays ? ?Recommend MRI ? ?I will call her with the results ?

## 2022-01-21 ENCOUNTER — Ambulatory Visit (HOSPITAL_COMMUNITY)
Admission: RE | Admit: 2022-01-21 | Discharge: 2022-01-21 | Disposition: A | Discharge status: Home / Self Care | Payer: 59 | Source: Ambulatory Visit | Attending: Orthopedic Surgery | Admitting: Orthopedic Surgery

## 2022-01-21 DIAGNOSIS — M545 Low back pain, unspecified: Secondary | ICD-10-CM | POA: Insufficient documentation

## 2022-02-02 ENCOUNTER — Other Ambulatory Visit: Payer: Self-pay

## 2022-02-02 ENCOUNTER — Telehealth: Payer: Self-pay | Admitting: Orthopedic Surgery

## 2022-02-02 DIAGNOSIS — M545 Low back pain, unspecified: Secondary | ICD-10-CM

## 2022-02-02 MED ORDER — GABAPENTIN 100 MG PO CAPS: 100.0000 mg | ORAL_CAPSULE | Freq: Three times a day (TID) | ORAL | 2 refills | Status: DC

## 2022-02-02 MED ORDER — DICLOFENAC POTASSIUM 50 MG PO TABS: 50.0000 mg | ORAL_TABLET | Freq: Two times a day (BID) | ORAL | 3 refills | Status: DC

## 2022-02-02 NOTE — Telephone Encounter (Signed)
IMPRESSION: ?1. Lumbar degenerative changes of the lumbar spine with mild spinal ?canal stenosis at L3-4 and L4-5. ?2. Mild bilateral neural foraminal narrowing at L2-3, L3-4 and L4-5. ?3. Advanced facet degenerative changes at L4-5 and L5-S1 with marrow ?and periarticular soft tissue edema at L4-5 suggesting active ?osteoarthritis. ?  ?  ?Electronically Signed ?  By: Pedro Earls M.D. ?  On: 01/21/2022 15:59 ? ? ?Meds ordered this encounter  ?Medications  ? gabapentin (NEURONTIN) 100 MG capsule  ?  Sig: Take 1 capsule (100 mg total) by mouth 3 (three) times daily.  ?  Dispense:  90 capsule  ?  Refill:  2  ? diclofenac (CATAFLAM) 50 MG tablet  ?  Sig: Take 1 tablet (50 mg total) by mouth 2 (two) times daily.  ?  Dispense:  60 tablet  ?  Refill:  3  ? ?Referral to [place] neuro[facility]  ?

## 2022-02-05 ENCOUNTER — Encounter: Payer: Self-pay | Admitting: Gastroenterology

## 2022-02-12 ENCOUNTER — Encounter: Payer: Self-pay | Admitting: Gastroenterology

## 2022-02-12 ENCOUNTER — Ambulatory Visit (AMBULATORY_SURGERY_CENTER): Payer: 59 | Admitting: Gastroenterology

## 2022-02-12 VITALS — BP 116/75 | HR 78 | Temp 98.9°F | Resp 20 | Ht 64.0 in | Wt 186.0 lb

## 2022-02-12 DIAGNOSIS — K317 Polyp of stomach and duodenum: Secondary | ICD-10-CM | POA: Diagnosis not present

## 2022-02-12 DIAGNOSIS — K319 Disease of stomach and duodenum, unspecified: Secondary | ICD-10-CM | POA: Diagnosis not present

## 2022-02-12 DIAGNOSIS — R195 Other fecal abnormalities: Secondary | ICD-10-CM | POA: Diagnosis not present

## 2022-02-12 DIAGNOSIS — K259 Gastric ulcer, unspecified as acute or chronic, without hemorrhage or perforation: Secondary | ICD-10-CM | POA: Diagnosis not present

## 2022-02-12 DIAGNOSIS — K5730 Diverticulosis of large intestine without perforation or abscess without bleeding: Secondary | ICD-10-CM | POA: Diagnosis not present

## 2022-02-12 DIAGNOSIS — K219 Gastro-esophageal reflux disease without esophagitis: Secondary | ICD-10-CM

## 2022-02-12 DIAGNOSIS — K449 Diaphragmatic hernia without obstruction or gangrene: Secondary | ICD-10-CM

## 2022-02-12 MED ORDER — OMEPRAZOLE 40 MG PO CPDR: 40.0000 mg | DELAYED_RELEASE_CAPSULE | Freq: Two times a day (BID) | ORAL | 3 refills | Status: DC

## 2022-02-12 MED ORDER — SODIUM CHLORIDE 0.9 % IV SOLN: 500.0000 mL | Freq: Once | INTRAVENOUS | Status: DC

## 2022-02-12 NOTE — Progress Notes (Signed)
A and O x3. Report to RN. Tolerated MAC anesthesia well. Teeth unchanged after procedure.  ?

## 2022-02-12 NOTE — Patient Instructions (Addendum)
Handouts on diverticulosis and hiatal hernia given to patient.  ?Await pathology results. ?Resume previous diet and continue present medications. ?Avoid NSAID's (naproxen, Ibuprofen, and other non-steroidal anti-inflammatory medications) ?Increase Omeprazole to 40 mg twice a day - pick up prescription from [facility]  ?Repeat upper endoscopy and colonoscopy for at least 8 weeks out with 2 day prep.  ? ? ? ?YOU HAD AN ENDOSCOPIC PROCEDURE TODAY AT [facility]:   Refer to the procedure report that was given to you for any specific questions about what was found during the examination.  If the procedure report does not answer your questions, please call your gastroenterologist to clarify.  If you requested that your care partner not be given the details of your procedure findings, then the procedure report has been included in a sealed envelope for you to review at your convenience later. ? ?YOU SHOULD EXPECT: Some feelings of bloating in the abdomen. Passage of more gas than usual.  Walking can help get rid of the air that was put into your GI tract during the procedure and reduce the bloating. If you had a lower endoscopy (such as a colonoscopy or flexible sigmoidoscopy) you may notice spotting of blood in your stool or on the toilet paper. If you underwent a bowel prep for your procedure, you may not have a normal bowel movement for a few days. ? ?Please Note:  You might notice some irritation and congestion in your nose or some drainage.  This is from the oxygen used during your procedure.  There is no need for concern and it should clear up in a day or so. ? ?SYMPTOMS TO REPORT IMMEDIATELY: ? ?Following lower endoscopy (colonoscopy or flexible sigmoidoscopy): ? Excessive amounts of blood in the stool ? Significant tenderness or worsening of abdominal pains ? Swelling of the abdomen that is new, acute ? Fever of 100?F or higher ? ?Following upper endoscopy (EGD) ? Vomiting of blood or coffee  ground material ? New chest pain or pain under the shoulder blades ? Painful or persistently difficult swallowing ? New shortness of breath ? Fever of 100?F or higher ? Black, tarry-looking stools ? ?For urgent or emergent issues, a gastroenterologist can be reached at any hour by calling [phone]. ?Do not use MyChart messaging for urgent concerns.  ? ? ?DIET:  We do recommend a small meal at first, but then you may proceed to your regular diet.  Drink plenty of fluids but you should avoid alcoholic beverages for 24 hours. ? ?ACTIVITY:  You should plan to take it easy for the rest of today and you should NOT DRIVE or use heavy machinery until tomorrow (because of the sedation medicines used during the test).   ? ?FOLLOW UP: ?Our staff will call the number listed on your records 48-72 hours following your procedure to check on you and address any questions or concerns that you may have regarding the information given to you following your procedure. If we do not reach you, we will leave a message.  We will attempt to reach you two times.  During this call, we will ask if you have developed any symptoms of COVID 19. If you develop any symptoms (ie: fever, flu-like symptoms, shortness of breath, cough etc.) before then, please call [phone].  If you test positive for Covid 19 in the 2 weeks post procedure, please call and report this information to [place].   ? ?If any biopsies were taken you will be contacted  by phone or by letter within the next 1-3 weeks.  Please call us at [phone] if you have not heard about the biopsies in 3 weeks.  ? ? ?SIGNATURES/CONFIDENTIALITY: ?You and/or your care partner have signed paperwork which will be entered into your electronic medical record.  These signatures attest to the fact that that the information above on your After Visit Summary has been reviewed and is understood.  Full responsibility of the confidentiality of this discharge information lies with you and/or  your care-partner.  ?

## 2022-02-12 NOTE — Op Note (Signed)
[place] ?Patient Name: Ann Foley ?Procedure Date: 02/12/2022 2:15 PM ?MRN: [id number] ?Endoscopist: Halvor Behrend E. Candis Schatz , MD ?Age: 56 ?Referring MD:  ?Date of Birth: August 05, 1966 ?Gender: Female ?Account #: [id number] ?Procedure:                Upper GI endoscopy ?Indications:              Esophageal reflux symptoms that persist despite  ?                          appropriate therapy ?Medicines:                Monitored Anesthesia Care ?Procedure:                Pre-Anesthesia Assessment: ?                          - Prior to the procedure, a History and Physical  ?                          was performed, and patient medications and  ?                          allergies were reviewed. The patient's tolerance of  ?                          previous anesthesia was also reviewed. The risks  ?                          and benefits of the procedure and the sedation  ?                          options and risks were discussed with the patient.  ?                          All questions were answered, and informed consent  ?                          was obtained. Prior Anticoagulants: The patient has  ?                          taken no previous anticoagulant or antiplatelet  ?                          agents. ASA Grade Assessment: II - A patient with  ?                          mild systemic disease. After reviewing the risks  ?                          and benefits, the patient was deemed in  ?                          satisfactory condition to undergo the procedure. ?  After obtaining informed consent, the endoscope was  ?                          passed under direct vision. Throughout the  ?                          procedure, the patient's blood pressure, pulse, and  ?                          oxygen saturations were monitored continuously. The  ?                          Endoscope was introduced through the mouth, and  ?                          advanced to the second part of  duodenum. The upper  ?                          GI endoscopy was accomplished without difficulty.  ?                          The patient tolerated the procedure well. ?Scope In: ?Scope Out: ?Findings:                 The examined portions of the nasopharynx,  ?                          oropharynx and larynx were normal. ?                          The examined esophagus was normal. ?                          The gastroesophageal flap valve was visualized  ?                          endoscopically and classified as Hill Grade IV (no  ?                          fold, wide open lumen, hiatal hernia present). ?                          A 5 cm hiatal hernia was present. ?                          One non-bleeding cratered gastric ulcer with no  ?                          stigmata of bleeding was found in the prepyloric  ?                          region of the stomach. The lesion was 3 mm in  ?  largest dimension. Biopsies were taken with a cold  ?                          forceps for histology. Estimated blood loss was  ?                          minimal. ?                          Multiple localized diminutive erosions with  ?                          stigmata of recent bleeding were found in the  ?                          gastric antrum. Biopsies were taken with a cold  ?                          forceps for Helicobacter pylori testing. Estimated  ?                          blood loss was minimal. ?                          Multiple 2 to 8 mm sessile polyps with no bleeding  ?                          and no stigmata of recent bleeding were found in  ?                          the gastric body. Four of these polyps were removed  ?                          with a cold snare. Resection and retrieval were  ?                          complete. Estimated blood loss was minimal. ?                          The exam of the stomach was otherwise normal. ?                          The examined duodenum  was normal. ?Complications:            No immediate complications. ?Estimated Blood Loss:     Estimated blood loss was minimal. ?Impression:               - The examined portions of the nasopharynx,  ?                          oropharynx and larynx were normal. ?                          - Normal esophagus. ?                          -  Gastroesophageal flap valve classified as Hill  ?                          Grade IV (no fold, wide open lumen, hiatal hernia  ?                          present). ?                          - 5 cm hiatal hernia. ?                          - Non-bleeding gastric ulcer with no stigmata of  ?                          bleeding. Biopsied. ?                          - Erosive gastropathy with stigmata of recent  ?                          bleeding. Biopsied. ?                          - Multiple gastric polyps. Resected and retrieved. ?                          - Normal examined duodenum. ?Recommendation:           - Patient has a contact number available for  ?                          emergencies. The signs and symptoms of potential  ?                          delayed complications were discussed with the  ?                          patient. Return to normal activities tomorrow.  ?                          Written discharge instructions were provided to the  ?                          patient. ?                          - Resume previous diet. ?                          - Continue present medications. ?                          - Await pathology results. ?                          - Repeat upper endoscopy in 8 weeks to check  ?  healing. ?                          - Avoid NSAIDs ?                          - Increase omeprazole to 40 mg PO BID ?Sava Proby E. Candis Schatz, MD ?02/12/2022 3:10:29 PM ?This report has been signed electronically. ?

## 2022-02-12 NOTE — Progress Notes (Signed)
[facility] History and Physical ? ? ?Primary Care Physician:  Jonathon Jordan, MD ? ? ?Reason for Procedure:   Positive FIT test, refractory GERD symptoms ? ?Plan:    Colonoscopy, EGD ? ? ? ? ?HPI: DAYTONA HEDMAN is a 56 y.o. female undergoing colonoscopy following a positive FIT test.  She had a colonoscopy in 2006 which was unremarkable.  She has controlled typical GERD symptoms which are suboptimally controlled with once daily omeprazole.  No dysphagia or weight loss.  No overt blood in stool.  No family history of GI malignancy. ? ? ?Past Medical History:  ?Diagnosis Date  ? Allergy   ? Anxiety   ? Asthma   ? BV (bacterial vaginosis) 11/29/2013  ? Contraceptive management 05/07/2015  ? Depression   ? Diverticulosis   ? GERD (gastroesophageal reflux disease)   ? Hiatal hernia 2006  ? small; Dr Verdia Kuba  ? Hyperlipidemia   ? Hypertension   ? Hypothyroidism   ? Plantar fasciitis   ? Restless leg syndrome   ? Thyroid disease   ? Vaginal discharge 11/29/2013  ? Vesicles 05/07/2015  ? Vitamin D deficiency   ? ? ?Past Surgical History:  ?Procedure Laterality Date  ? CARPAL TUNNEL RELEASE Bilateral   ? COLONOSCOPY    ? ELBOW ARTHROSCOPY Right   ? SHOULDER ARTHROSCOPY Right   ? thumb repair    ? right  ? THYROIDECTOMY    ? UPPER GASTROINTESTINAL ENDOSCOPY    ? WISDOM TOOTH EXTRACTION    ? ? ?Prior to Admission medications   ?Medication Sig Start Date End Date Taking? Authorizing Provider  ?ADVAIR DISKUS 250-50 MCG/DOSE AEPB Inhale 1 puff into the lungs 2 (two) times daily.  08/07/13  Yes [provider]  ?buPROPion (WELLBUTRIN SR) 150 MG 12 hr tablet Take 150 mg by mouth 2 (two) times daily.  09/27/13  Yes [provider]  ?diclofenac (CATAFLAM) 50 MG tablet Take 1 tablet (50 mg total) by mouth 2 (two) times daily. 02/02/22  Yes Carole Civil, MD  ?escitalopram (LEXAPRO) 20 MG tablet Take 20 mg by mouth daily.  07/04/13  Yes [provider]  ?levocetirizine (XYZAL) 5 MG tablet  Take 5 mg by mouth every evening.  09/25/13  Yes [provider]  ?SYNTHROID 100 MCG tablet Take 100 mcg by mouth daily. 02/04/22  Yes [provider]  ?gabapentin (NEURONTIN) 100 MG capsule Take 1 capsule (100 mg total) by mouth 3 (three) times daily. 02/02/22   Carole Civil, MD  ?losartan-hydrochlorothiazide [facility]) 50-12.5 MG per tablet Take 1 tablet by mouth daily.  ?Patient not taking: Reported on 02/12/2022 09/27/13   [provider]  ?omeprazole (PRILOSEC) 20 MG capsule Take 20 mg by mouth daily. ?Patient not taking: Reported on 02/12/2022 11/03/16   [provider]  ?PROAIR HFA 108 (90 BASE) MCG/ACT inhaler Inhale 1 puff into the lungs as needed.  11/19/13   [provider]  ?zolpidem (AMBIEN) 10 MG tablet Take 10 mg by mouth at bedtime as needed.  09/25/13   [provider]  ? ? ?Current Outpatient Medications  ?Medication Sig Dispense Refill  ? ADVAIR DISKUS 250-50 MCG/DOSE AEPB Inhale 1 puff into the lungs 2 (two) times daily.     ? buPROPion (WELLBUTRIN SR) 150 MG 12 hr tablet Take 150 mg by mouth 2 (two) times daily.     ? diclofenac (CATAFLAM) 50 MG tablet Take 1 tablet (50 mg total) by mouth 2 (two) times daily. [place]  tablet 3  ? escitalopram (LEXAPRO) 20 MG tablet Take 20 mg by mouth daily.     ? levocetirizine (XYZAL) 5 MG tablet Take 5 mg by mouth every evening.     ? SYNTHROID 100 MCG tablet Take 100 mcg by mouth daily.    ? gabapentin (NEURONTIN) 100 MG capsule Take 1 capsule (100 mg total) by mouth 3 (three) times daily. 90 capsule 2  ? losartan-hydrochlorothiazide (HYZAAR) 50-12.5 MG per tablet Take 1 tablet by mouth daily.  (Patient not taking: Reported on 02/12/2022)    ? omeprazole (PRILOSEC) 20 MG capsule Take 20 mg by mouth daily. (Patient not taking: Reported on 02/12/2022)    ? PROAIR HFA 108 (90 BASE) MCG/ACT inhaler Inhale 1 puff into the lungs as needed.     ? zolpidem (AMBIEN) 10 MG tablet Take 10 mg by mouth at bedtime as needed.      ? ?Current Facility-Administered Medications  ?Medication Dose Route Frequency Provider Last Rate Last Admin  ? 0.9 %  sodium chloride infusion  500 mL Intravenous Once Daryel November, MD      ? ? ?Allergies as of 02/12/2022 - Review Complete 02/12/2022  ?Allergen Reaction Noted  ? Shellfish-derived products Anaphylaxis 08/31/2021  ? ? ?Family History  ?Problem Relation Age of Onset  ? Hypertension Mother   ? Hypothyroidism Mother   ? Hypertension Father   ? Hypertension Sister   ? Hyperlipidemia Sister   ? Asthma Paternal Aunt   ? Heart disease Paternal Grandmother   ? Heart disease Paternal Grandfather   ? Colon cancer Neg Hx   ? Esophageal cancer Neg Hx   ? Rectal cancer Neg Hx   ? Stomach cancer Neg Hx   ? ? ?Social History  ? ?Socioeconomic History  ? Marital status: Divorced  ?  Spouse name: Not on file  ? Number of children: 0  ? Years of education: Not on file  ? Highest education level: Not on file  ?Occupational History  ? Occupation: clerical  ?Tobacco Use  ? Smoking status: Never  ? Smokeless tobacco: Never  ?Vaping Use  ? Vaping Use: Never used  ?Substance and Sexual Activity  ? Alcohol use: No  ? Drug use: No  ? Sexual activity: Not Currently  ?  Birth control/protection: Post-menopausal  ?Other Topics Concern  ? Not on file  ?Social History Narrative  ? Not on file  ? ?Social Determinants of Health  ? ?Financial Resource Strain: Not on file  ?Food Insecurity: Not on file  ?Transportation Needs: Not on file  ?Physical Activity: Not on file  ?Stress: Not on file  ?Social Connections: Not on file  ?Intimate Partner Violence: Not on file  ? ? ?Review of Systems: ? ?All other review of systems negative except as mentioned in the HPI. ? ?Physical Exam: ?Vital signs ?BP 135/86   Pulse 69   Temp 98.9 ?F (37.2 ?C)   Resp 16   Ht '5\' 4"'$  (1.626 m)   Wt 186 lb (84.4 kg)   LMP 05/25/2017 (Approximate)   SpO2 97%   BMI 31.93 kg/m?  ? ?General:   Alert,  Well-developed, well-nourished, pleasant and  cooperative in NAD ?Airway:  Mallampati 2 ?Lungs:  Clear throughout to auscultation.   ?Heart:  Regular rate and rhythm; no murmurs, clicks, rubs,  or gallops. ?Abdomen:  Soft, nontender and nondistended. Normal bowel sounds.   ?Neuro/Psych:  Normal mood and affect. A and O x 3 ? ? ?Yared Susan E. Candis Schatz,  MD ?[facility] ? ? ? ?

## 2022-02-12 NOTE — Progress Notes (Signed)
Called to room to assist during endoscopic procedure.  Patient ID and intended procedure confirmed with present staff. Received instructions for my participation in the procedure from the performing physician.  

## 2022-02-12 NOTE — Op Note (Signed)
[facility] ?Patient Name: Ann Foley ?Procedure Date: 02/12/2022 2:15 PM ?MRN: [id number] ?Endoscopist: Tammy Wickliffe E. Candis Schatz , MD ?Age: 56 ?Referring MD:  ?Date of Birth: July 20, 1966 ?Gender: Female ?Account #: [id number] ?Procedure:                Colonoscopy ?Indications:              Positive fecal immunochemical test ?Medicines:                Monitored Anesthesia Care ?Procedure:                Pre-Anesthesia Assessment: ?                          - Prior to the procedure, a History and Physical  ?                          was performed, and patient medications and  ?                          allergies were reviewed. The patient's tolerance of  ?                          previous anesthesia was also reviewed. The risks  ?                          and benefits of the procedure and the sedation  ?                          options and risks were discussed with the patient.  ?                          All questions were answered, and informed consent  ?                          was obtained. Prior Anticoagulants: The patient has  ?                          taken no previous anticoagulant or antiplatelet  ?                          agents. ASA Grade Assessment: II - A patient with  ?                          mild systemic disease. After reviewing the risks  ?                          and benefits, the patient was deemed in  ?                          satisfactory condition to undergo the procedure. ?                          After obtaining informed consent, the colonoscope  ?  was passed under direct vision. Throughout the  ?                          procedure, the patient's blood pressure, pulse, and  ?                          oxygen saturations were monitored continuously. The  ?                          Olympus CF-[id number] (#[id number]) Colonoscope was  ?                          introduced through the anus and advanced to the the  ?                          cecum, identified by  appendiceal orifice and  ?                          ileocecal valve. The colonoscopy was performed  ?                          without difficulty. The patient tolerated the  ?                          procedure well. The quality of the bowel  ?                          preparation was poor. The ileocecal valve,  ?                          appendiceal orifice, and rectum were photographed.  ?                          The bowel preparation used was Sutab via split dose  ?                          instruction. ?Scope In: 2:36:55 PM ?Scope Out: 2:57:30 PM ?Scope Withdrawal Time: 0 hours 15 minutes 22 seconds  ?Total Procedure Duration: 0 hours 20 minutes 35 seconds  ?Findings:                 The perianal and digital rectal examinations were  ?                          normal. Pertinent negatives include normal  ?                          sphincter tone and no palpable rectal lesions. ?                          Multiple small and large-mouthed diverticula were  ?                          found in the sigmoid colon, descending colon and  ?  cecum. ?                          The exam was otherwise normal throughout the  ?                          examined colon. ?                          The retroflexed view of the distal rectum and anal  ?                          verge was normal and showed no anal or rectal  ?                          abnormalities. ?Complications:            No immediate complications. ?Estimated Blood Loss:     Estimated blood loss: none. ?Impression:               - Preparation of the colon was poor. ?                          - Diverticulosis in the sigmoid colon, in the  ?                          descending colon and in the cecum. ?                          - The distal rectum and anal verge are normal on  ?                          retroflexion view. ?                          - No specimens collected. ?                          - No colon mass or large bulky polyps. Due  to poor  ?                          bowel prep, smaller polyps or even larger flat  ?                          polyps may have been missed ?Recommendation:           - Patient has a contact number available for  ?                          emergencies. The signs and symptoms of potential  ?                          delayed complications were discussed with the  ?                          patient. Return to normal activities tomorrow.  ?  Written discharge instructions were provided to the  ?                          patient. ?                          - Resume previous diet. ?                          - Continue present medications. ?                          - Repeat colonoscopy in <1 year because the bowel  ?                          preparation was poor. ?                          - Recommend 2 day bowel prep with next colonoscopy ?Kalven Ganim E. Candis Schatz, MD ?02/12/2022 3:04:02 PM ?This report has been signed electronically. ?

## 2022-02-12 NOTE — Progress Notes (Signed)
Pt rescheduled for Endocolon on 04/20/22 at 10:30. New prep instructions for 2-day Miralax sent via MyChart as discussed with pt. Called pt, no answer; left detailed message giving procedure date and time, arrival time and reminding her that a care partner must be present the entire time of procedure. Instructed pt to read over her instructions very carefully and to note that she needed to stop eating raw vegetables, seeds, nuts, corn, etc 5 days before the procedure. Instructed pt to call the office if she needed to reschedule or if she had questions about the prep.  ?

## 2022-02-16 ENCOUNTER — Telehealth: Payer: Self-pay

## 2022-02-16 NOTE — Telephone Encounter (Signed)
No answer, left message to call back later today, B.Marquest Gunkel RN. 

## 2022-02-16 NOTE — Telephone Encounter (Signed)
?  Follow up Call- ? ? ?  02/12/2022  ?  1:57 PM  ?Call back number  ?Post procedure Call Back phone  # [phone] cell  ?Permission to leave phone message Yes  ?  ? ?Patient questions: ? ?Do you have a fever, pain , or abdominal swelling? No. ?Pain Score  0 * ? ?Have you tolerated food without any problems? Yes.   ? ?Have you been able to return to your normal activities? Yes.   ? ?Do you have any questions about your discharge instructions: ?Diet   No. ?Medications  No. ?Follow up visit  No. ? ?Do you have questions or concerns about your Care? No. ? ?Actions: ?* If pain score is 4 or above: ?No action needed, pain <4. ? ? ?

## 2022-02-23 NOTE — Progress Notes (Signed)
Ms. Ann Foley, ? ?The biopsies taken from your stomach were notable for reactive gastropathy which is often related to use of certain medications (usually NSAIDs), but there was no evidence of Helicobacter pylori infection. This common finding is not felt to necessarily be a cause of any particular symptom and there is no specific treatment or further evaluation recommended. ? ?The polyps resected from your stomach were benign fundic gland polyps.  There was no evidence of infection with Helicobacter pylori or intestinal metaplasia/dysplasia.  These types of polyps are typically secondary to acid suppression therapy and no specific follow-up required for these small, benign polyps. ? ?Please continue taking your omeprazole and repeat upper endoscopy in 8 weeks to assess healing of the stomach ulcer.  It is important to avoid NSAIDs, as this was the likely cause of the stomach inflammation. ? ? ?

## 2022-04-12 ENCOUNTER — Encounter: Payer: Self-pay | Admitting: Gastroenterology

## 2022-04-20 ENCOUNTER — Ambulatory Visit (AMBULATORY_SURGERY_CENTER): Payer: 59 | Admitting: Gastroenterology

## 2022-04-20 ENCOUNTER — Other Ambulatory Visit: Payer: Self-pay

## 2022-04-20 ENCOUNTER — Telehealth: Payer: Self-pay

## 2022-04-20 ENCOUNTER — Encounter: Payer: Self-pay | Admitting: Gastroenterology

## 2022-04-20 VITALS — BP 125/83 | HR 72 | Temp 97.7°F | Resp 16 | Ht 64.0 in | Wt 186.0 lb

## 2022-04-20 DIAGNOSIS — D123 Benign neoplasm of transverse colon: Secondary | ICD-10-CM

## 2022-04-20 DIAGNOSIS — R7989 Other specified abnormal findings of blood chemistry: Secondary | ICD-10-CM

## 2022-04-20 DIAGNOSIS — K5730 Diverticulosis of large intestine without perforation or abscess without bleeding: Secondary | ICD-10-CM | POA: Diagnosis not present

## 2022-04-20 DIAGNOSIS — K259 Gastric ulcer, unspecified as acute or chronic, without hemorrhage or perforation: Secondary | ICD-10-CM | POA: Diagnosis not present

## 2022-04-20 DIAGNOSIS — D122 Benign neoplasm of ascending colon: Secondary | ICD-10-CM | POA: Diagnosis not present

## 2022-04-20 DIAGNOSIS — K219 Gastro-esophageal reflux disease without esophagitis: Secondary | ICD-10-CM

## 2022-04-20 DIAGNOSIS — I8500 Esophageal varices without bleeding: Secondary | ICD-10-CM | POA: Diagnosis not present

## 2022-04-20 DIAGNOSIS — R195 Other fecal abnormalities: Secondary | ICD-10-CM | POA: Diagnosis not present

## 2022-04-20 DIAGNOSIS — K317 Polyp of stomach and duodenum: Secondary | ICD-10-CM | POA: Diagnosis present

## 2022-04-20 MED ORDER — SODIUM CHLORIDE 0.9 % IV SOLN: 500.0000 mL | Freq: Once | INTRAVENOUS | Status: DC

## 2022-04-20 NOTE — Patient Instructions (Addendum)
Handout on polyps, hiatal hernia, and diverticulosis provided   Await pathology results.   Continue current medications.  Okay to decrease omeprazole to once daily. Continue to avoid NSAID  YOU HAD AN ENDOSCOPIC PROCEDURE TODAY AT [facility]:   Refer to the procedure report that was given to you for any specific questions about what was found during the examination.  If the procedure report does not answer your questions, please call your gastroenterologist to clarify.  If you requested that your care partner not be given the details of your procedure findings, then the procedure report has been included in a sealed envelope for you to review at your convenience later.  YOU SHOULD EXPECT: Some feelings of bloating in the abdomen. Passage of more gas than usual.  Walking can help get rid of the air that was put into your GI tract during the procedure and reduce the bloating. If you had a lower endoscopy (such as a colonoscopy or flexible sigmoidoscopy) you may notice spotting of blood in your stool or on the toilet paper. If you underwent a bowel prep for your procedure, you may not have a normal bowel movement for a few days.  Please Note:  You might notice some irritation and congestion in your nose or some drainage.  This is from the oxygen used during your procedure.  There is no need for concern and it should clear up in a day or so.  SYMPTOMS TO REPORT IMMEDIATELY:  Following lower endoscopy (colonoscopy or flexible sigmoidoscopy):  Excessive amounts of blood in the stool  Significant tenderness or worsening of abdominal pains  Swelling of the abdomen that is new, acute  Fever of 100F or higher  Following upper endoscopy (EGD)  Vomiting of blood or coffee ground material  New chest pain or pain under the shoulder blades  Painful or persistently difficult swallowing  New shortness of breath  Fever of 100F or higher  Black, tarry-looking stools  For urgent or  emergent issues, a gastroenterologist can be reached at any hour by calling [phone]. Do not use MyChart messaging for urgent concerns.    DIET:  We do recommend a small meal at first, but then you may proceed to your regular diet.  Drink plenty of fluids but you should avoid alcoholic beverages for 24 hours.  ACTIVITY:  You should plan to take it easy for the rest of today and you should NOT DRIVE or use heavy machinery until tomorrow (because of the sedation medicines used during the test).    FOLLOW UP: Our staff will call the number listed on your records the next business day following your procedure.  We will call around 7:15- 8:00 am to check on you and address any questions or concerns that you may have regarding the information given to you following your procedure. If we do not reach you, we will leave a message.  If you develop any symptoms (ie: fever, flu-like symptoms, shortness of breath, cough etc.) before then, please call [phone].  If you test positive for Covid 19 in the 2 weeks post procedure, please call and report this information to [place].    If any biopsies were taken you will be contacted by phone or by letter within the next 1-3 weeks.  Please call us at [phone] if you have not heard about the biopsies in 3 weeks.    SIGNATURES/CONFIDENTIALITY: You and/or your care partner have signed paperwork which will be entered into your electronic  medical record.  These signatures attest to the fact that that the information above on your After Visit Summary has been reviewed and is understood.  Full responsibility of the confidentiality of this discharge information lies with you and/or your care-partner.

## 2022-04-20 NOTE — Progress Notes (Signed)
Called to room to assist during endoscopic procedure.  Patient ID and intended procedure confirmed with present staff. Received instructions for my participation in the procedure from the performing physician.  

## 2022-04-20 NOTE — Progress Notes (Signed)
[facility] History and Physical   Primary Care Physician:  Jonathon Jordan, MD   Reason for Procedure:   Follow up gastric ulcer, positive FIT  Plan:    EGD, colonoscopy     HPI: Ann Foley is a 56 y.o. female undergoing repeat EGD and colonoscopy.  On May 5th, she was found to have a small gastric ulcer attributed to NSAIDs.  A colonoscopy was performed that day to evaluate positive FIT test, but the bowel prep was poor quality.  Repeat colonoscopy today following a 2-day bowel prep.   Past Medical History:  Diagnosis Date   Allergy    Anxiety    Arthritis    Asthma    BV (bacterial vaginosis) 11/29/2013   Contraceptive management 05/07/2015   Depression    Diverticulosis    GERD (gastroesophageal reflux disease)    Hiatal hernia 2006   small; Dr Verdia Kuba   Hyperlipidemia    Hypertension    Hypothyroidism    Plantar fasciitis    Restless leg syndrome    Thyroid disease    Vaginal discharge 11/29/2013   Vesicles 05/07/2015   Vitamin D deficiency     Past Surgical History:  Procedure Laterality Date   CARPAL TUNNEL RELEASE Bilateral    COLONOSCOPY     ELBOW ARTHROSCOPY Right    SHOULDER ARTHROSCOPY Right    thumb repair     right   THYROIDECTOMY     UPPER GASTROINTESTINAL ENDOSCOPY     WISDOM TOOTH EXTRACTION      Prior to Admission medications   Medication Sig Start Date End Date Taking? Authorizing Provider  ADVAIR DISKUS 250-50 MCG/DOSE AEPB Inhale 1 puff into the lungs 2 (two) times daily.  08/07/13  Yes [provider]  buPROPion (WELLBUTRIN SR) 150 MG 12 hr tablet Take 150 mg by mouth 2 (two) times daily.  09/27/13  Yes [provider]  escitalopram (LEXAPRO) 20 MG tablet Take 20 mg by mouth daily.  07/04/13  Yes [provider]  gabapentin (NEURONTIN) 300 MG capsule 1 capsule   Yes [provider]  levocetirizine (XYZAL) 5 MG tablet Take 5 mg by mouth every evening.  09/25/13  Yes [provider]  Multiple Vitamin (MULTIVITAMIN ADULT) TABS 1 tablet   Yes [provider]  omeprazole (PRILOSEC) 40 MG capsule Take 1 capsule (40 mg total) by mouth 2 (two) times daily. 02/12/22  Yes Daryel November, MD  SYNTHROID 100 MCG tablet Take 100 mcg by mouth daily. 02/04/22  Yes [provider]  losartan-hydrochlorothiazide (HYZAAR) 50-12.5 MG per tablet Take 1 tablet by mouth daily.  Patient not taking: Reported on 02/12/2022 09/27/13   [provider]  PROAIR HFA 108 (90 BASE) MCG/ACT inhaler Inhale 1 puff into the lungs as needed.  11/19/13   [provider]  Semaglutide-Weight Management (WEGOVY) 0.25 MG/0.5ML SOAJ 0.5 mL Patient not taking: Reported on 04/20/2022 04/16/22   [provider]  zolpidem (AMBIEN) 10 MG tablet Take 10 mg by mouth at bedtime as needed.  09/25/13   [provider]    Current Outpatient Medications  Medication Sig Dispense Refill   ADVAIR DISKUS 250-50 MCG/DOSE AEPB Inhale 1 puff into the lungs 2 (two) times daily.      buPROPion (WELLBUTRIN SR) 150 MG 12 hr tablet Take 150 mg by mouth 2 (two) times daily.      escitalopram (LEXAPRO) 20 MG tablet Take 20 mg by mouth daily.  gabapentin (NEURONTIN) 300 MG capsule 1 capsule     levocetirizine (XYZAL) 5 MG tablet Take 5 mg by mouth every evening.      Multiple Vitamin (MULTIVITAMIN ADULT) TABS 1 tablet     omeprazole (PRILOSEC) 40 MG capsule Take 1 capsule (40 mg total) by mouth 2 (two) times daily. 90 capsule 3   SYNTHROID 100 MCG tablet Take 100 mcg by mouth daily.     losartan-hydrochlorothiazide (HYZAAR) 50-12.5 MG per tablet Take 1 tablet by mouth daily.  (Patient not taking: Reported on 02/12/2022)     PROAIR HFA 108 (90 BASE) MCG/ACT inhaler Inhale 1 puff into the lungs as needed.      Semaglutide-Weight Management (WEGOVY) 0.25 MG/0.5ML SOAJ 0.5 mL (Patient not taking: Reported on 04/20/2022)     zolpidem (AMBIEN) 10 MG tablet Take 10 mg by mouth at  bedtime as needed.      Current Facility-Administered Medications  Medication Dose Route Frequency Provider Last Rate Last Admin   0.9 %  sodium chloride infusion  500 mL Intravenous Once Daryel November, MD        Allergies as of 04/20/2022 - Review Complete 04/20/2022  Allergen Reaction Noted   Shellfish-derived products Anaphylaxis 08/31/2021   Liraglutide -weight management  04/16/2022    Family History  Problem Relation Age of Onset   Hypertension Mother    Hypothyroidism Mother    Hypertension Father    Hypertension Sister    Hyperlipidemia Sister    Asthma Paternal Aunt    Heart disease Paternal Grandmother    Heart disease Paternal Grandfather    Colon cancer Neg Hx    Esophageal cancer Neg Hx    Rectal cancer Neg Hx    Stomach cancer Neg Hx     Social History   Socioeconomic History   Marital status: Divorced    Spouse name: Not on file   Number of children: 0   Years of education: Not on file   Highest education level: Not on file  Occupational History   Occupation: clerical  Tobacco Use   Smoking status: Never   Smokeless tobacco: Never  Vaping Use   Vaping Use: Never used  Substance and Sexual Activity   Alcohol use: No   Drug use: No   Sexual activity: Not Currently    Birth control/protection: Post-menopausal  Other Topics Concern   Not on file  Social History Narrative   Not on file   Social Determinants of Health   Financial Resource Strain: Not on file  Food Insecurity: Not on file  Transportation Needs: Not on file  Physical Activity: Not on file  Stress: Not on file  Social Connections: Not on file  Intimate Partner Violence: Not on file    Review of Systems:  All other review of systems negative except as mentioned in the HPI.  Physical Exam: Vital signs BP 117/81   Pulse 85   Temp 97.7 F (36.5 C) (Temporal)   Ht '5\' 4"'$  (1.626 m)   Wt 186 lb (84.4 kg)   LMP 05/25/2017 (Approximate)   SpO2 96%   BMI 31.93 kg/m    General:   Alert,  Well-developed, well-nourished, pleasant and cooperative in NAD Airway:  Mallampati 1 Lungs:  Clear throughout to auscultation.   Heart:  Regular rate and rhythm; no murmurs, clicks, rubs,  or gallops. Abdomen:  Soft, nontender and nondistended. Normal bowel sounds.   Neuro/Psych:  Normal mood and affect. A and O x 3  Frona Yost E. Candis Schatz, MD [facility]

## 2022-04-20 NOTE — Telephone Encounter (Signed)
-----   Message from Daryel November, MD sent at 04/20/2022 12:02 PM EDT ----- Regarding: Loreli Slot,  Can you please order a RUQUS for Ms. Cousins to evaluate for possible cirrhosis/portal hypertension?

## 2022-04-20 NOTE — Op Note (Signed)
[facility] Patient Name: Ann Foley Procedure Date: 04/20/2022 10:28 AM MRN: [id number] Endoscopist: Rodman. Candis Schatz , MD Age: 56 Referring MD:  Date of Birth: Dec 25, 1965 Gender: Female Account #: [id number] Procedure:                Colonoscopy Indications:              Positive fecal immunochemical test, poor bowel prep                            in May 2023 Medicines:                Monitored Anesthesia Care Procedure:                Pre-Anesthesia Assessment:                           - Prior to the procedure, a History and Physical                            was performed, and patient medications and                            allergies were reviewed. The patient's tolerance of                            previous anesthesia was also reviewed. The risks                            and benefits of the procedure and the sedation                            options and risks were discussed with the patient.                            All questions were answered, and informed consent                            was obtained. Prior Anticoagulants: The patient has                            taken no previous anticoagulant or antiplatelet                            agents. ASA Grade Assessment: II - A patient with                            mild systemic disease. After reviewing the risks                            and benefits, the patient was deemed in                            satisfactory condition to undergo the procedure.                           -  Prior to the procedure, a History and Physical                            was performed, and patient medications and                            allergies were reviewed. The patient's tolerance of                            previous anesthesia was also reviewed. The risks                            and benefits of the procedure and the sedation                            options and risks were discussed with the patient.                             All questions were answered, and informed consent                            was obtained. Prior Anticoagulants: The patient has                            taken no previous anticoagulant or antiplatelet                            agents. ASA Grade Assessment: II - A patient with                            mild systemic disease. After reviewing the risks                            and benefits, the patient was deemed in                            satisfactory condition to undergo the procedure.                           After obtaining informed consent, the colonoscope                            was passed under direct vision. Throughout the                            procedure, the patient's blood pressure, pulse, and                            oxygen saturations were monitored continuously. The                            CF [id number] #[id number] was introduced through the anus  and advanced to the the terminal ileum, with                            identification of the appendiceal orifice and IC                            valve. The colonoscopy was performed without                            difficulty. The patient tolerated the procedure                            well. The quality of the bowel preparation was                            adequate. The terminal ileum, ileocecal valve,                            appendiceal orifice, and rectum were photographed.                            The bowel preparation used was Miralax via 2 day                            dose instruction. Scope In: 10:43:13 AM Scope Out: 11:10:42 AM Scope Withdrawal Time: 0 hours 21 minutes 45 seconds  Total Procedure Duration: 0 hours 27 minutes 29 seconds  Findings:                 The perianal and digital rectal examinations were                            normal. Pertinent negatives include normal                            sphincter tone and no palpable rectal  lesions.                           Three sessile polyps were found in the ascending                            colon. The polyps were 2 to 3 mm in size. These                            polyps were removed with a cold snare. Resection                            and retrieval were complete. Estimated blood loss                            was minimal.                           A 5 mm polyp was found in the hepatic flexure. The  polyp was sessile. The polyp was removed with a                            cold snare. Resection and retrieval were complete.                            Estimated blood loss was minimal.                           A 5 mm polyp was found in the transverse colon. The                            polyp was sessile. The polyp was removed with a                            cold snare. Resection and retrieval were complete.                            Estimated blood loss was minimal.                           Multiple small and large-mouthed diverticula were                            found in the sigmoid colon, descending colon and                            ascending colon. There was no evidence of                            diverticular bleeding.                           The exam was otherwise normal throughout the                            examined colon.                           The terminal ileum appeared normal.                           The retroflexed view of the distal rectum and anal                            verge was normal and showed no anal or rectal                            abnormalities. Complications:            No immediate complications. Estimated Blood Loss:     Estimated blood loss was minimal. Impression:               - Three 2 to 3 mm polyps in the ascending colon,  removed with a cold snare. Resected and retrieved.                           - One 5 mm polyp at the hepatic flexure, removed                             with a cold snare. Resected and retrieved.                           - One 5 mm polyp in the transverse colon, removed                            with a cold snare. Resected and retrieved.                           - Moderate diverticulosis in the sigmoid colon, in                            the descending colon and in the ascending colon.                            There was no evidence of diverticular bleeding.                           - The examined portion of the ileum was normal.                           - The distal rectum and anal verge are normal on                            retroflexion view. Recommendation:           - Patient has a contact number available for                            emergencies. The signs and symptoms of potential                            delayed complications were discussed with the                            patient. Return to normal activities tomorrow.                            Written discharge instructions were provided to the                            patient.                           - Resume previous diet.                           - Continue present medications.                           -  Await pathology results.                           - Repeat colonoscopy (date not yet determined) for                            surveillance based on pathology results. Avel Ogawa E. Candis Schatz, MD 04/20/2022 11:27:52 AM This report has been signed electronically.

## 2022-04-20 NOTE — Telephone Encounter (Signed)
Order for RUG [place] entered in epic. Pt will be contacted by rad scheduling to set up this appt.

## 2022-04-20 NOTE — Op Note (Signed)
[facility] Patient Name: Ann Foley Procedure Date: 04/20/2022 10:26 AM MRN: [id number] Endoscopist: Nicki Reaper E. Candis Schatz , MD Age: 56 Referring MD:  Date of Birth: Sep 26, 1966 Gender: Female Account #: [id number] Procedure:                Upper GI endoscopy Indications:              Follow-up of gastric ulcer Medicines:                Monitored Anesthesia Care Procedure:                Pre-Anesthesia Assessment:                           - Prior to the procedure, a History and Physical                            was performed, and patient medications and                            allergies were reviewed. The patient's tolerance of                            previous anesthesia was also reviewed. The risks                            and benefits of the procedure and the sedation                            options and risks were discussed with the patient.                            All questions were answered, and informed consent                            was obtained. Prior Anticoagulants: The patient has                            taken no previous anticoagulant or antiplatelet                            agents. ASA Grade Assessment: II - A patient with                            mild systemic disease. After reviewing the risks                            and benefits, the patient was deemed in                            satisfactory condition to undergo the procedure.                           After obtaining informed consent, the endoscope was  passed under direct vision. Throughout the                            procedure, the patient's blood pressure, pulse, and                            oxygen saturations were monitored continuously. The                            Endoscope was introduced through the mouth, and                            advanced to the third part of duodenum. The upper                            GI endoscopy was  accomplished without difficulty.                            The patient tolerated the procedure well. Scope In: Scope Out: Findings:                 The examined portions of the nasopharynx,                            oropharynx and larynx were normal.                           The distal esophagus was mildly tortuous.                           Grade I varices were found in the lower third of                            the esophagus.                           A 5 cm hiatal hernia was present.                           Multiple 2 to 6 mm sessile polyps were found in the                            gastric body. These were previously found to be                            fundic gland polyps and no further polyps were                            removed today.                           The exam of the stomach was otherwise normal.                           The examined duodenum was normal.  Complications:            No immediate complications. Estimated Blood Loss:     Estimated blood loss: none. Impression:               - The examined portions of the nasopharynx,                            oropharynx and larynx were normal.                           - Tortuous esophagus.                           - Prominent veins in the distal esophagus                            suggestive of grade I esophageal varices.                           - 5 cm hiatal hernia.                           - Multiple gastric polyps consistent with known                            fundic gland polyps.                           - Normal examined duodenum.                           - No specimens collected.                           - The previously noted gastric ulcer has healed. Recommendation:           - Patient has a contact number available for                            emergencies. The signs and symptoms of potential                            delayed complications were discussed with the                             patient. Return to normal activities tomorrow.                            Written discharge instructions were provided to the                            patient.                           - Resume previous diet.                           -  Continue present medications.                           - Recommend ultrasound of the liver to evaluate for                            cirrhosis given prominent esophageal veins.                           - Continue to avoid NSAIDs                           - Okay to decrease omeprazole to once daily. Ann Foley E. Candis Schatz, MD 04/20/2022 11:21:29 AM This report has been signed electronically.

## 2022-04-20 NOTE — Progress Notes (Signed)
Sedate, gd SR, tolerated procedure well, VSS, report to RN 

## 2022-04-21 ENCOUNTER — Telehealth: Payer: Self-pay

## 2022-04-21 NOTE — Telephone Encounter (Signed)
  Follow up Call-     04/20/2022    9:43 AM 02/12/2022    1:57 PM  Call back number  Post procedure Call Back phone  # [phone] cell  Permission to leave phone message Yes Yes     Left message

## 2022-04-22 NOTE — Progress Notes (Signed)
Ann Foley,   At least 3 of the 5 polyps that I removed during your recent procedure were completely benign but were proven to be "pre-cancerous" polyps that MAY have grown into cancers if they had not been removed.  Studies shows that at least 20% of women over age 58 and 30% of men over age 42 have pre-cancerous polyps.  Based on current nationally recognized surveillance guidelines, I recommend that you have a repeat colonoscopy in 5 years.   If you develop any new rectal bleeding, abdominal pain or significant bowel habit changes, please contact me before then.  Please remember to schedule the ultrasound of your liver to make sure the prominent veins in your esophagus are not because of liver disease.

## 2022-05-11 ENCOUNTER — Ambulatory Visit (HOSPITAL_COMMUNITY)
Admission: RE | Admit: 2022-05-11 | Discharge: 2022-05-11 | Disposition: A | Discharge status: Home / Self Care | Payer: 59 | Source: Ambulatory Visit | Attending: Gastroenterology | Admitting: Gastroenterology

## 2022-05-11 DIAGNOSIS — R7989 Other specified abnormal findings of blood chemistry: Secondary | ICD-10-CM | POA: Diagnosis not present

## 2022-05-20 NOTE — Progress Notes (Signed)
Ann Foley,  Your ultrasound looked great.  Your liver is normal in appearance, with no evidence of chronic liver disease.  The prominent veins seen in your esophagus during the EGD are most likely 'normal variants' and do no require any further evaluation.

## 2022-07-27 ENCOUNTER — Ambulatory Visit (INDEPENDENT_AMBULATORY_CARE_PROVIDER_SITE_OTHER): Payer: 59

## 2022-07-27 ENCOUNTER — Encounter: Payer: Self-pay | Admitting: Orthopaedic Surgery

## 2022-07-27 ENCOUNTER — Ambulatory Visit (INDEPENDENT_AMBULATORY_CARE_PROVIDER_SITE_OTHER): Payer: 59 | Admitting: Orthopaedic Surgery

## 2022-07-27 DIAGNOSIS — M1812 Unilateral primary osteoarthritis of first carpometacarpal joint, left hand: Secondary | ICD-10-CM | POA: Diagnosis not present

## 2022-07-27 NOTE — Progress Notes (Signed)
Office Visit Note   Patient: Ann Foley           Date of Birth: September 11, 1966           MRN: [id number] Visit Date: 07/27/2022              Requested by: Jonathon Jordan, MD [address] [place],  [place] [address] PCP: Jonathon Jordan, MD   Assessment & Plan: Visit Diagnoses:  1. Primary osteoarthritis of first carpometacarpal joint of left hand     Plan: Impression is advanced left thumb CMC arthritis.  X-rays show significant degenerative changes consistent with bone-on-bone arthritis.  Treatment options were reviewed and based on her options she has elected to move forward with a left thumb CMC arthroplasty.  Risk benefits prognosis was reviewed again.  All questions answered to her satisfaction.  Jackelyn Poling will contact the patient to schedule surgery.  Follow-Up Instructions: No follow-ups on file.   Orders:  Orders Placed This Encounter  Procedures   XR Finger Thumb Left   No orders of the defined types were placed in this encounter.     Procedures: No procedures performed   Clinical Data: No additional findings.   Subjective: Chief Complaint  Patient presents with   Left Hand - Pain    Thumb pain    HPI Joseph Art is a very pleasant 56 year old female who I performed a right thumb CMC arthroplasty a few years ago.  She did very well from the surgery and was very pleased.  In the last couple years she has had worsening pain at the base of her left thumb reminiscent of the pain that she had in her right thumb.  She has pain with daily activities and is and is interfering with quality of life and sleeping.  Review of Systems  Constitutional: Negative.   HENT: Negative.    Eyes: Negative.   Respiratory: Negative.    Cardiovascular: Negative.   Endocrine: Negative.   Musculoskeletal: Negative.   Neurological: Negative.   Hematological: Negative.   Psychiatric/Behavioral: Negative.    All other systems reviewed and are  negative.    Objective: Vital Signs: LMP 05/25/2017 (Approximate)   Physical Exam Vitals and nursing note reviewed.  Constitutional:      Appearance: She is well-developed.  HENT:     Head: Normocephalic and atraumatic.  Pulmonary:     Effort: Pulmonary effort is normal.  Abdominal:     Palpations: Abdomen is soft.  Musculoskeletal:     Cervical back: Neck supple.  Skin:    General: Skin is warm.     Capillary Refill: Capillary refill takes less than 2 seconds.  Neurological:     Mental Status: She is alert and oriented to person, place, and time.  Psychiatric:        Behavior: Behavior normal.        Thought Content: Thought content normal.        Judgment: Judgment normal.     Ortho Exam Examination of left thumb shows pain without crepitus to [facility] grind test.  She has no triggering of the thumb.  Normal capillary refill.  Sensation intact.  Grip and pinch strength is slightly decreased secondary to pain. Specialty Comments:  No specialty comments available.  Imaging: No results found.   PMFS History: Patient Active Problem List   Diagnosis Date Noted   Primary osteoarthritis of first carpometacarpal joint of left hand 07/27/2022   Gastroesophageal reflux disease 01/12/2022   Positive FIT (fecal  immunochemical test) 01/12/2022   Right carpal tunnel syndrome 11/21/2019   Arthritis of carpometacarpal (CMC) joint of right thumb 09/27/2019   Ulnar impingement syndrome of right upper extremity 03/27/2019   Carpal tunnel syndrome, right upper limb 03/27/2019   Moody 07/19/2018   Night sweats 07/19/2018   Hot flashes due to menopause 07/19/2018   Screening for colorectal cancer 07/19/2018   Encounter for gynecological examination with Papanicolaou smear of cervix 07/19/2018   History of HPV infection 07/19/2018   Vesicles 05/07/2015   Contraceptive management 05/07/2015   Vaginal discharge 11/29/2013   BV (bacterial vaginosis) 11/29/2013   Past Medical History:   Diagnosis Date   Allergy    Anxiety    Arthritis    Asthma    BV (bacterial vaginosis) 11/29/2013   Contraceptive management 05/07/2015   Depression    Diverticulosis    GERD (gastroesophageal reflux disease)    Hiatal hernia 2006   small; Dr Verdia Kuba   Hyperlipidemia    Hypertension    Hypothyroidism    Plantar fasciitis    Restless leg syndrome    Thyroid disease    Vaginal discharge 11/29/2013   Vesicles 05/07/2015   Vitamin D deficiency     Family History  Problem Relation Age of Onset   Hypertension Mother    Hypothyroidism Mother    Hypertension Father    Hypertension Sister    Hyperlipidemia Sister    Asthma Paternal Aunt    Heart disease Paternal Grandmother    Heart disease Paternal Grandfather    Colon cancer Neg Hx    Esophageal cancer Neg Hx    Rectal cancer Neg Hx    Stomach cancer Neg Hx     Past Surgical History:  Procedure Laterality Date   CARPAL TUNNEL RELEASE Bilateral    COLONOSCOPY     ELBOW ARTHROSCOPY Right    SHOULDER ARTHROSCOPY Right    thumb repair     right   THYROIDECTOMY     UPPER GASTROINTESTINAL ENDOSCOPY     WISDOM TOOTH EXTRACTION     Social History   Occupational History   Occupation: clerical  Tobacco Use   Smoking status: Never   Smokeless tobacco: Never  Vaping Use   Vaping Use: Never used  Substance and Sexual Activity   Alcohol use: No   Drug use: No   Sexual activity: Not Currently    Birth control/protection: Post-menopausal

## 2022-08-13 ENCOUNTER — Other Ambulatory Visit: Payer: Self-pay | Admitting: Gastroenterology

## 2022-08-13 DIAGNOSIS — K219 Gastro-esophageal reflux disease without esophagitis: Secondary | ICD-10-CM

## 2022-09-27 ENCOUNTER — Other Ambulatory Visit: Payer: Self-pay | Admitting: Physician Assistant

## 2022-09-27 MED ORDER — ONDANSETRON HCL 4 MG PO TABS: 4.0000 mg | ORAL_TABLET | Freq: Three times a day (TID) | ORAL | 0 refills | Status: DC | PRN

## 2022-09-27 MED ORDER — OXYCODONE-ACETAMINOPHEN 5-325 MG PO TABS: 1.0000 | ORAL_TABLET | Freq: Three times a day (TID) | ORAL | 0 refills | Status: DC | PRN

## 2022-09-30 DIAGNOSIS — M1812 Unilateral primary osteoarthritis of first carpometacarpal joint, left hand: Secondary | ICD-10-CM | POA: Diagnosis not present

## 2022-10-14 ENCOUNTER — Encounter: Payer: Self-pay | Admitting: Orthopaedic Surgery

## 2022-10-14 ENCOUNTER — Ambulatory Visit (INDEPENDENT_AMBULATORY_CARE_PROVIDER_SITE_OTHER): Payer: 59

## 2022-10-14 ENCOUNTER — Ambulatory Visit (INDEPENDENT_AMBULATORY_CARE_PROVIDER_SITE_OTHER): Payer: 59 | Admitting: Physician Assistant

## 2022-10-14 DIAGNOSIS — M1812 Unilateral primary osteoarthritis of first carpometacarpal joint, left hand: Secondary | ICD-10-CM

## 2022-10-14 NOTE — Progress Notes (Signed)
Post-Op Visit Note   Patient: Ann Foley           Date of Birth: 29-Feb-1966           MRN: [id number] Visit Date: 10/14/2022 PCP: Jonathon Jordan, MD   Assessment & Plan:  Chief Complaint:  Chief Complaint  Patient presents with   Left Hand - Follow-up    Left thumb [facility] arthroplasty 09/30/2022   Visit Diagnoses:  1. Primary osteoarthritis of first carpometacarpal joint of left hand     Plan: patient comes in two weeks out from left hand cmc arthroplasty.  Doing well in regards to pain.  Complaining of decreased sensation to most of the left thumb.  Has been compliant in splint.  Exam of left hand reveals a well healed incision with nylon sutures in place.  Decreased sensation to the thumb.  At this point, sutures were removed and steri strips applied. Thumb spica cast applied.  Follow up in two weeks for repeat exam and cast removal.  Follow-Up Instructions: Return in about 2 weeks (around 10/28/2022).   Orders:  Orders Placed This Encounter  Procedures   XR Finger Thumb Left   No orders of the defined types were placed in this encounter.   Imaging: XR Finger Thumb Left  Result Date: 10/14/2022 Stable xrays   PMFS History: Patient Active Problem List   Diagnosis Date Noted   Primary osteoarthritis of first carpometacarpal joint of left hand 07/27/2022   Gastroesophageal reflux disease 01/12/2022   Positive FIT (fecal immunochemical test) 01/12/2022   Right carpal tunnel syndrome 11/21/2019   Arthritis of carpometacarpal (CMC) joint of right thumb 09/27/2019   Ulnar impingement syndrome of right upper extremity 03/27/2019   Carpal tunnel syndrome, right upper limb 03/27/2019   Moody 07/19/2018   Night sweats 07/19/2018   Hot flashes due to menopause 07/19/2018   Screening for colorectal cancer 07/19/2018   Encounter for gynecological examination with Papanicolaou smear of cervix 07/19/2018   History of HPV infection 07/19/2018   Vesicles 05/07/2015    Contraceptive management 05/07/2015   Vaginal discharge 11/29/2013   BV (bacterial vaginosis) 11/29/2013   Past Medical History:  Diagnosis Date   Allergy    Anxiety    Arthritis    Asthma    BV (bacterial vaginosis) 11/29/2013   Contraceptive management 05/07/2015   Depression    Diverticulosis    GERD (gastroesophageal reflux disease)    Hiatal hernia 2006   small; Dr Verdia Kuba   Hyperlipidemia    Hypertension    Hypothyroidism    Plantar fasciitis    Restless leg syndrome    Thyroid disease    Vaginal discharge 11/29/2013   Vesicles 05/07/2015   Vitamin D deficiency     Family History  Problem Relation Age of Onset   Hypertension Mother    Hypothyroidism Mother    Hypertension Father    Hypertension Sister    Hyperlipidemia Sister    Asthma Paternal Aunt    Heart disease Paternal Grandmother    Heart disease Paternal Grandfather    Colon cancer Neg Hx    Esophageal cancer Neg Hx    Rectal cancer Neg Hx    Stomach cancer Neg Hx     Past Surgical History:  Procedure Laterality Date   CARPAL TUNNEL RELEASE Bilateral    COLONOSCOPY     ELBOW ARTHROSCOPY Right    SHOULDER ARTHROSCOPY Right    thumb repair     right   THYROIDECTOMY  UPPER GASTROINTESTINAL ENDOSCOPY     WISDOM TOOTH EXTRACTION     Social History   Occupational History   Occupation: clerical  Tobacco Use   Smoking status: Never   Smokeless tobacco: Never  Vaping Use   Vaping Use: Never used  Substance and Sexual Activity   Alcohol use: No   Drug use: No   Sexual activity: Not Currently    Birth control/protection: Post-menopausal

## 2022-10-20 ENCOUNTER — Other Ambulatory Visit (HOSPITAL_COMMUNITY): Payer: Self-pay | Admitting: Adult Health

## 2022-10-20 DIAGNOSIS — Z1231 Encounter for screening mammogram for malignant neoplasm of breast: Secondary | ICD-10-CM

## 2022-10-25 ENCOUNTER — Ambulatory Visit (HOSPITAL_COMMUNITY)
Admission: RE | Admit: 2022-10-25 | Discharge: 2022-10-25 | Disposition: A | Discharge status: Home / Self Care | Payer: 59 | Source: Ambulatory Visit | Attending: Adult Health | Admitting: Adult Health

## 2022-10-25 DIAGNOSIS — Z1231 Encounter for screening mammogram for malignant neoplasm of breast: Secondary | ICD-10-CM | POA: Insufficient documentation

## 2022-10-29 ENCOUNTER — Encounter: Payer: Self-pay | Admitting: Rehabilitative and Restorative Service Providers"

## 2022-10-29 ENCOUNTER — Ambulatory Visit (INDEPENDENT_AMBULATORY_CARE_PROVIDER_SITE_OTHER): Payer: 59 | Admitting: Rehabilitative and Restorative Service Providers"

## 2022-10-29 ENCOUNTER — Encounter: Payer: Self-pay | Admitting: Orthopaedic Surgery

## 2022-10-29 ENCOUNTER — Other Ambulatory Visit: Payer: Self-pay

## 2022-10-29 ENCOUNTER — Ambulatory Visit (INDEPENDENT_AMBULATORY_CARE_PROVIDER_SITE_OTHER): Payer: 59 | Admitting: Orthopaedic Surgery

## 2022-10-29 DIAGNOSIS — M1812 Unilateral primary osteoarthritis of first carpometacarpal joint, left hand: Secondary | ICD-10-CM

## 2022-10-29 DIAGNOSIS — M25542 Pain in joints of left hand: Secondary | ICD-10-CM

## 2022-10-29 DIAGNOSIS — R600 Localized edema: Secondary | ICD-10-CM

## 2022-10-29 DIAGNOSIS — R278 Other lack of coordination: Secondary | ICD-10-CM

## 2022-10-29 DIAGNOSIS — M25642 Stiffness of left hand, not elsewhere classified: Secondary | ICD-10-CM

## 2022-10-29 DIAGNOSIS — M6281 Muscle weakness (generalized): Secondary | ICD-10-CM

## 2022-10-29 NOTE — Therapy (Signed)
OUTPATIENT OCCUPATIONAL THERAPY ORTHO EVALUATION  Patient Name: Ann Foley MRN: [id number] DOB:June 17, 1966, 57 y.o., female Today's Date: 10/29/2022  PCP: Jonathon Jordan, MD REFERRING PROVIDER:  Leandrew Koyanagi, MD    END OF SESSION:  OT End of Session - 10/29/22 1125     Visit Number 1    Number of Visits 10    Date for OT Re-Evaluation 12/10/22    Authorization Type [employer]    OT Start Time 1119    OT Stop Time 1153    OT Time Calculation (min) 34 min    Equipment Utilized During Treatment orthotic materials    Activity Tolerance Patient tolerated treatment well;No increased pain;Patient limited by fatigue;Patient limited by pain    Behavior During Therapy [facility] for tasks assessed/performed             Past Medical History:  Diagnosis Date   Allergy    Anxiety    Arthritis    Asthma    BV (bacterial vaginosis) 11/29/2013   Contraceptive management 05/07/2015   Depression    Diverticulosis    GERD (gastroesophageal reflux disease)    Hiatal hernia 2006   small; Dr Verdia Kuba   Hyperlipidemia    Hypertension    Hypothyroidism    Plantar fasciitis    Restless leg syndrome    Thyroid disease    Vaginal discharge 11/29/2013   Vesicles 05/07/2015   Vitamin D deficiency    Past Surgical History:  Procedure Laterality Date   CARPAL TUNNEL RELEASE Bilateral    COLONOSCOPY     ELBOW ARTHROSCOPY Right    SHOULDER ARTHROSCOPY Right    thumb repair     right   THYROIDECTOMY     UPPER GASTROINTESTINAL ENDOSCOPY     WISDOM TOOTH EXTRACTION     Patient Active Problem List   Diagnosis Date Noted   Primary osteoarthritis of first carpometacarpal joint of left hand 07/27/2022   Gastroesophageal reflux disease 01/12/2022   Positive FIT (fecal immunochemical test) 01/12/2022   Right carpal tunnel syndrome 11/21/2019   Arthritis of carpometacarpal (CMC) joint of right thumb 09/27/2019   Ulnar impingement syndrome of right upper extremity 03/27/2019   Carpal tunnel  syndrome, right upper limb 03/27/2019   Moody 07/19/2018   Night sweats 07/19/2018   Hot flashes due to menopause 07/19/2018   Screening for colorectal cancer 07/19/2018   Encounter for gynecological examination with Papanicolaou smear of cervix 07/19/2018   History of HPV infection 07/19/2018   Vesicles 05/07/2015   Contraceptive management 05/07/2015   Vaginal discharge 11/29/2013   BV (bacterial vaginosis) 11/29/2013    ONSET DATE: 09/30/22  REFERRING DIAG: B51.02 (ICD-10-CM) - Primary osteoarthritis of first carpometacarpal joint of left hand   THERAPY DIAG:  Primary osteoarthritis of first carpometacarpal joint of left hand  Localized edema  Muscle weakness (generalized)  Other lack of coordination  Pain in joint of left hand  Stiffness of left hand, not elsewhere classified  Rationale for Evaluation and Treatment: Rehabilitation  SUBJECTIVE:   SUBJECTIVE STATEMENT: She states that she previously got her right thumb CMC operated on years ago and now her left thumb has been more painful and so she got an operation there as well for arthroplasty.  She has no significant pains now.  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  She has significant problems pre and postsurgery due to healing pain etc.   PERTINENT HISTORY: Per MD: "Elizabeth Sauer  and treat s/p left thumb CMC arthroplasty. Hand based thermoplastic splint. "   PRECAUTIONS: None  WEIGHT BEARING RESTRICTIONS: Yes <5# recommended now  PAIN:  Are you having pain? Not resting now  Rating: 0/10 at rest now, up to 2/10 at worst in past week   FALLS: Has patient fallen in last 6 months? No   PLOF: Independent  PATIENT GOALS: Improve use of left nondominant hand to return back to work, typing  OBJECTIVE: (All objective assessments below are from initial evaluation on: 10/29/22 unless otherwise specified.)    HAND DOMINANCE: Right   ADLs: Overall ADLs: States  decreased ability to grab, hold household objects, pain and inability to open containers, perform FMS tasks (manipulate fasteners on clothing), mild to moderate bathing problems as well.    FUNCTIONAL OUTCOME MEASURES: Eval: Quck DASH 59% impairment today  (Higher % Score  =  More Impairment)     UPPER EXTREMITY ROM     Shoulder to Wrist AROM Left eval  Forearm supination   Forearm pronation    Wrist flexion 43  Wrist extension 68  Wrist ulnar deviation   Wrist radial deviation   Functional dart thrower's motion (F-DTM) in ulnar flexion   F-DTM in radial extension    (Blank rows = not tested)   Hand AROM Left eval  Full Fist Ability (or Gap to Distal Palmar Crease) Full minus thumb  Thumb Opposition  (Kapandji Scale)  3  Thumb MCP (0-60) 0-14  Thumb IP (0-80) 0-27  Thumb Radial Abduction Span    Thumb Palmar Abduction Span    (Blank rows = not tested)   UPPER EXTREMITY MMT:    Eval:  NT at eval due to recent and still healing injuries. Will be tested when appropriate.   MMT Left TBD  Shoulder flexion   Shoulder abduction   Shoulder adduction   Shoulder extension   Shoulder internal rotation   Shoulder external rotation   Middle trapezius   Lower trapezius   Elbow flexion   Elbow extension   Forearm supination   Forearm pronation   Wrist flexion   Wrist extension   Wrist ulnar deviation   Wrist radial deviation   (Blank rows = not tested)  HAND FUNCTION: Eval: Observed weakness in affected left hand will be tested when safe and appropriate. Grip strength Right: TBD lbs, Left: TBD lbs   COORDINATION: Eval: Observed coordination impairments with affected hand.  Details to be determined next session. Box and Blocks Test: Left TBD Blocks today (TBD is [facility]); 9 Hole Peg Test Right: TBDsec, Left: TBD sec  SENSATION: Eval:  Light touch intact today, though diminished around sx area    EDEMA:   Eval:  Mildly swollen in Lt thumb base now   COGNITION: Eval:  Overall cognitive status: WFL for evaluation today   OBSERVATIONS:   Eval: Steri-Strips still in place 2 weeks after stitches taken out, wound looks healed underneath no signs of infection no significant redness swelling only very mild.   TODAY'S TREATMENT:  Post-evaluation treatment: For her safety and self-care she was educated no lifting more than 2 to 3 pounds in the left hand now and must be done with brace on.  She was educated to not cause pain not forced radial abduction or adduction to index finger to avoid stretching out the surgical site.  Additionally she was educated to wash and pat dry her hand but do not soak around the surgical site yet.  She states understanding.  She was also crafted a custom hand-based orthotic to protect the thumb basal joint, leave the IP joint free and the wrist free now 4 weeks postop.  It fits well and supports her she states nonpainful to wear and actually feels good and relieving she can rest in this brace.  She demos donning and doffing of the brace well.  Initially she was also given home exercise program consisting of range of motion to the forearm hand wrist and thumb gently, tolerably as well as IP joint blocking exercises to be done without pain as well.  These were reviewed quickly with her and she performs back stating understanding.  They will be reviewed and upgraded next week as able.  Exercises - Turn [employer] Facing Up & Down  - 4-6 x daily - 10-15 reps - Bend and Pull Back Wrist SLOWLY  - 4 x daily - 10-15 reps - Wrist AROM Dart Throwers Motion  - 4 x daily - 10-15 reps - Tendon Glides  - 4-6 x daily - 3-5 reps - 2-3 seconds hold - Seated Thumb Circumduction AROM  - 4-6 x daily - 10-15 reps - Thumb Opposition  - 4-6 x daily - 10 reps - Thumb AROM IP Blocking  - 4-6 x daily - 10-15 reps    PATIENT EDUCATION: Education details: See tx section above for details  Person educated: Patient Education method: Verbal Instruction, Teach back, Handouts   Education comprehension: States and demonstrates understanding, Additional Education required    HOME EXERCISE PROGRAM: Access Code: [email or web address] URL: [email or web address] Date: 10/29/2022 Prepared by: Benito Mccreedy   GOALS: Goals reviewed with patient? Yes   SHORT TERM GOALS: (STG required if POC>30 days) Target Date: 11/05/22  Pt will obtain protective, custom orthotic. Goal status: MET   2.  Pt will demo/state understanding of initial HEP to improve pain levels and prerequisite motion. Goal status: INITIAL   LONG TERM GOALS: Target Date: 12/10/22  Pt will improve functional ability by decreased impairment per Quick DASH assessment from 59% to 15% or better, for better quality of life. Goal status: INITIAL  2.  Pt will improve grip strength in Lt hand to at least 40lbs for functional use at home and in IADLs. Goal status: INITIAL  3.  Pt will improve A/ROM in Lt thumb MCP J flexion from 14* to at least 40*, to have functional motion for tasks like reach and grasp.  Goal status: INITIAL  4.  Pt will improve strength in Lt wrist flex/ext to at least 4+/5 MMT to have increased functional ability to carry out selfcare and higher-level homecare tasks with no difficulty. Goal status: INITIAL  5.  Pt will improve coordination skills in Lt hand, as seen by [facility] score on 9HPT testing to have increased functional ability to carry out fine motor tasks (fasteners, etc.) and more complex, coordinated IADLs (meal prep, sports, etc.).  Goal status: INITIAL (pending peg testing next session)    ASSESSMENT:  CLINICAL IMPRESSION: Patient is a 57 y.o. female who was seen today for occupational therapy evaluation for left thumb pain and stiffness and decreased functional ability following arthritis and basal joint arthroplasty.  She will benefit from outpatient occupational therapy to decrease symptoms and increase quality of life.   PERFORMANCE DEFICITS: in functional skills  including ADLs, IADLs, coordination, dexterity, proprioception, ROM, strength, pain, fascial restrictions, flexibility, Fine motor control, Gross motor control, body mechanics, endurance, decreased knowledge of precautions, and UE functional use, cognitive skills including problem solving and safety  awareness, and psychosocial skills including coping strategies, environmental adaptation, and habits.   IMPAIRMENTS: are limiting patient from ADLs, IADLs, work, and leisure.   COMORBIDITIES: may have co-morbidities  that affects occupational performance. Patient will benefit from skilled OT to address above impairments and improve overall function.  MODIFICATION OR ASSISTANCE TO COMPLETE EVALUATION: No modification of tasks or assist necessary to complete an evaluation.  OT OCCUPATIONAL PROFILE AND HISTORY: Problem focused assessment: Including review of records relating to presenting problem.  CLINICAL DECISION MAKING: LOW - limited treatment options, no task modification necessary  REHAB POTENTIAL: Excellent  EVALUATION COMPLEXITY: Low      PLAN:  OT FREQUENCY: 1-2x/week  OT DURATION: 6 weeks  PLANNED INTERVENTIONS: self care/ADL training, therapeutic exercise, therapeutic activity, neuromuscular re-education, manual therapy, scar mobilization, passive range of motion, splinting, ultrasound, compression bandaging, moist heat, cryotherapy, contrast bath, patient/family education, and coping strategies training  RECOMMENDED OTHER SERVICES: none now   CONSULTED AND AGREED WITH PLAN OF CARE: Patient  PLAN FOR NEXT SESSION: Check orthotic as needed, check initial home exercise program and upgrade as tolerated and per protocols.   Benito Mccreedy, OTR/L, CHT 10/29/2022, 12:18 PM

## 2022-10-29 NOTE — Progress Notes (Signed)
Post-Op Visit Note   Patient: Ann Foley           Date of Birth: Sep 27, 1966           MRN: [id number] Visit Date: 10/29/2022 PCP: Jonathon Jordan, MD   Assessment & Plan:  Chief Complaint:  Chief Complaint  Patient presents with   Left Hand - Follow-up    Left thumb [facility] arthroplasty 09/30/2022   Visit Diagnoses:  1. Primary osteoarthritis of first carpometacarpal joint of left hand     Plan: Ahyana is 4 weeks status post left thumb CMC arthroplasty.  She's doing well overall.  No real complaints.  Examination shows healed surgical scar.  No signs of infection.  Expected range of motion of the thumb.  We removed the cast today and sent her upstairs for hand based splint.  She will start OT.  Recheck in 6 weeks.  Follow-Up Instructions: Return in about 6 weeks (around 12/10/2022).   Orders:  Orders Placed This Encounter  Procedures   Ambulatory referral to Occupational Therapy   Ambulatory referral to Physical Therapy   No orders of the defined types were placed in this encounter.   Imaging: No results found.  PMFS History: Patient Active Problem List   Diagnosis Date Noted   Primary osteoarthritis of first carpometacarpal joint of left hand 07/27/2022   Gastroesophageal reflux disease 01/12/2022   Positive FIT (fecal immunochemical test) 01/12/2022   Right carpal tunnel syndrome 11/21/2019   Arthritis of carpometacarpal (CMC) joint of right thumb 09/27/2019   Ulnar impingement syndrome of right upper extremity 03/27/2019   Carpal tunnel syndrome, right upper limb 03/27/2019   Moody 07/19/2018   Night sweats 07/19/2018   Hot flashes due to menopause 07/19/2018   Screening for colorectal cancer 07/19/2018   Encounter for gynecological examination with Papanicolaou smear of cervix 07/19/2018   History of HPV infection 07/19/2018   Vesicles 05/07/2015   Contraceptive management 05/07/2015   Vaginal discharge 11/29/2013   BV (bacterial vaginosis) 11/29/2013    Past Medical History:  Diagnosis Date   Allergy    Anxiety    Arthritis    Asthma    BV (bacterial vaginosis) 11/29/2013   Contraceptive management 05/07/2015   Depression    Diverticulosis    GERD (gastroesophageal reflux disease)    Hiatal hernia 2006   small; Dr Verdia Kuba   Hyperlipidemia    Hypertension    Hypothyroidism    Plantar fasciitis    Restless leg syndrome    Thyroid disease    Vaginal discharge 11/29/2013   Vesicles 05/07/2015   Vitamin D deficiency     Family History  Problem Relation Age of Onset   Hypertension Mother    Hypothyroidism Mother    Hypertension Father    Hypertension Sister    Hyperlipidemia Sister    Asthma Paternal Aunt    Heart disease Paternal Grandmother    Heart disease Paternal Grandfather    Colon cancer Neg Hx    Esophageal cancer Neg Hx    Rectal cancer Neg Hx    Stomach cancer Neg Hx     Past Surgical History:  Procedure Laterality Date   CARPAL TUNNEL RELEASE Bilateral    COLONOSCOPY     ELBOW ARTHROSCOPY Right    SHOULDER ARTHROSCOPY Right    thumb repair     right   THYROIDECTOMY     UPPER GASTROINTESTINAL ENDOSCOPY     WISDOM TOOTH EXTRACTION     Social History  Occupational History   Occupation: clerical  Tobacco Use   Smoking status: Never   Smokeless tobacco: Never  Vaping Use   Vaping Use: Never used  Substance and Sexual Activity   Alcohol use: No   Drug use: No   Sexual activity: Not Currently    Birth control/protection: Post-menopausal

## 2022-11-01 NOTE — Therapy (Incomplete)
OUTPATIENT OCCUPATIONAL THERAPY TREATMENT NOTE  Patient Name: Ann Foley MRN: [id number] DOB:Feb 18, 1966, 57 y.o., female Today's Date: 11/01/2022  PCP: Jonathon Jordan, MD REFERRING PROVIDER:  Leandrew Koyanagi, MD    END OF SESSION:    Past Medical History:  Diagnosis Date   Allergy    Anxiety    Arthritis    Asthma    BV (bacterial vaginosis) 11/29/2013   Contraceptive management 05/07/2015   Depression    Diverticulosis    GERD (gastroesophageal reflux disease)    Hiatal hernia 2006   small; Dr Verdia Kuba   Hyperlipidemia    Hypertension    Hypothyroidism    Plantar fasciitis    Restless leg syndrome    Thyroid disease    Vaginal discharge 11/29/2013   Vesicles 05/07/2015   Vitamin D deficiency    Past Surgical History:  Procedure Laterality Date   CARPAL TUNNEL RELEASE Bilateral    COLONOSCOPY     ELBOW ARTHROSCOPY Right    SHOULDER ARTHROSCOPY Right    thumb repair     right   THYROIDECTOMY     UPPER GASTROINTESTINAL ENDOSCOPY     WISDOM TOOTH EXTRACTION     Patient Active Problem List   Diagnosis Date Noted   Primary osteoarthritis of first carpometacarpal joint of left hand 07/27/2022   Gastroesophageal reflux disease 01/12/2022   Positive FIT (fecal immunochemical test) 01/12/2022   Right carpal tunnel syndrome 11/21/2019   Arthritis of carpometacarpal (CMC) joint of right thumb 09/27/2019   Ulnar impingement syndrome of right upper extremity 03/27/2019   Carpal tunnel syndrome, right upper limb 03/27/2019   Moody 07/19/2018   Night sweats 07/19/2018   Hot flashes due to menopause 07/19/2018   Screening for colorectal cancer 07/19/2018   Encounter for gynecological examination with Papanicolaou smear of cervix 07/19/2018   History of HPV infection 07/19/2018   Vesicles 05/07/2015   Contraceptive management 05/07/2015   Vaginal discharge 11/29/2013   BV (bacterial vaginosis) 11/29/2013    ONSET DATE: 09/30/22  REFERRING DIAG: M42.68  (ICD-10-CM) - Primary osteoarthritis of first carpometacarpal joint of left hand   THERAPY DIAG:  No diagnosis found.  Rationale for Evaluation and Treatment: Rehabilitation  PERTINENT HISTORY: Per MD: "Eval and treat s/p left thumb CMC arthroplasty. Hand based thermoplastic splint. "   PRECAUTIONS: None; WEIGHT BEARING RESTRICTIONS: Yes <5# recommended now   SUBJECTIVE:   SUBJECTIVE STATEMENT: She states ***   that she previously got her right thumb CMC operated on years ago and now her left thumb has been more painful and so she got an operation there as well for arthroplasty.  She has no significant pains now.  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  She has significant problems pre and postsurgery due to healing pain etc.    PAIN:  Are you having pain? *** Not resting now  Rating: 0/10 at rest now, up to 2/10 at worst in past week  PATIENT GOALS: Improve use of left nondominant hand to return back to work, typing   OBJECTIVE: (All objective assessments below are from initial evaluation on: 10/29/22 unless otherwise specified.)   HAND DOMINANCE: Right   ADLs: Overall ADLs: States decreased ability to grab, hold household objects, pain and inability to open containers, perform FMS tasks (manipulate fasteners on clothing), mild to moderate bathing problems as well.    FUNCTIONAL OUTCOME MEASURES: Eval: Quck DASH 59% impairment today  (Higher %  Score  =  More Impairment)     UPPER EXTREMITY ROM     Shoulder to Wrist AROM Left eval  Forearm supination   Forearm pronation    Wrist flexion 43  Wrist extension 68  Wrist ulnar deviation   Wrist radial deviation   Functional dart thrower's motion (F-DTM) in ulnar flexion   F-DTM in radial extension    (Blank rows = not tested)   Hand AROM Left eval  Full Fist Ability (or Gap to Distal Palmar Crease) Full minus thumb  Thumb Opposition  (Kapandji Scale)  3  Thumb  MCP (0-60) 0-14  Thumb IP (0-80) 0-27  Thumb Radial Abduction Span    Thumb Palmar Abduction Span    (Blank rows = not tested)   UPPER EXTREMITY MMT:    Eval:  NT at eval due to recent and still healing injuries. Will be tested when appropriate.   MMT Left TBD  Shoulder flexion   Shoulder abduction   Shoulder adduction   Shoulder extension   Shoulder internal rotation   Shoulder external rotation   Middle trapezius   Lower trapezius   Elbow flexion   Elbow extension   Forearm supination   Forearm pronation   Wrist flexion   Wrist extension   Wrist ulnar deviation   Wrist radial deviation   (Blank rows = not tested)  HAND FUNCTION: Eval: Observed weakness in affected left hand will be tested when safe and appropriate. Grip strength Right: TBD lbs, Left: TBD lbs   COORDINATION: Eval: Observed coordination impairments with affected hand.  Details to be determined next session. Box and Blocks Test: Left TBD Blocks today (TBD is [facility]); 9 Hole Peg Test Right: TBDsec, Left: TBD sec  SENSATION: Eval:  Light touch intact today, though diminished around sx area    EDEMA:   Eval:  Mildly swollen in Lt thumb base now   OBSERVATIONS:   Eval: Steri-Strips still in place 2 weeks after stitches taken out, wound looks healed underneath no signs of infection no significant redness swelling only very mild.   TODAY'S TREATMENT:  11/04/22: *** Check orthotic as needed, check initial home exercise program and upgrade as tolerated and per protocols.   Post-evaluation treatment: For her safety and self-care she was educated no lifting more than 2 to 3 pounds in the left hand now and must be done with brace on.  She was educated to not cause pain not forced radial abduction or adduction to index finger to avoid stretching out the surgical site.  Additionally she was educated to wash and pat dry her hand but do not soak around the surgical site yet.  She states understanding.  She was also  crafted a custom hand-based orthotic to protect the thumb basal joint, leave the IP joint free and the wrist free now 4 weeks postop.  It fits well and supports her she states nonpainful to wear and actually feels good and relieving she can rest in this brace.  She demos donning and doffing of the brace well.  Initially she was also given home exercise program consisting of range of motion to the forearm hand wrist and thumb gently, tolerably as well as IP joint blocking exercises to be done without pain as well.  These were reviewed quickly with her and she performs back stating understanding.  They will be reviewed and upgraded next week as able.  Exercises - Turn [employer] Facing Up & Down  - 4-6 x daily - 10-15  reps - Bend and Pull Back Wrist SLOWLY  - 4 x daily - 10-15 reps - Wrist AROM Dart Throwers Motion  - 4 x daily - 10-15 reps - Tendon Glides  - 4-6 x daily - 3-5 reps - 2-3 seconds hold - Seated Thumb Circumduction AROM  - 4-6 x daily - 10-15 reps - Thumb Opposition  - 4-6 x daily - 10 reps - Thumb AROM IP Blocking  - 4-6 x daily - 10-15 reps  PATIENT EDUCATION: Education details: See tx section above for details  Person educated: Patient Education method: Verbal Instruction, Teach back, Handouts  Education comprehension: States and demonstrates understanding, Additional Education required    HOME EXERCISE PROGRAM: Access Code: [email or web address] URL: [email or web address] Date: 10/29/2022 Prepared by: Benito Mccreedy   GOALS: Goals reviewed with patient? Yes   SHORT TERM GOALS: (STG required if POC>30 days) Target Date: 11/05/22  Pt will obtain protective, custom orthotic. Goal status: MET   2.  Pt will demo/state understanding of initial HEP to improve pain levels and prerequisite motion. Goal status: INITIAL   LONG TERM GOALS: Target Date: 12/10/22  Pt will improve functional ability by decreased impairment per Quick DASH assessment from 59% to 15% or better, for  better quality of life. Goal status: INITIAL  2.  Pt will improve grip strength in Lt hand to at least 40lbs for functional use at home and in IADLs. Goal status: INITIAL  3.  Pt will improve A/ROM in Lt thumb MCP J flexion from 14* to at least 40*, to have functional motion for tasks like reach and grasp.  Goal status: INITIAL  4.  Pt will improve strength in Lt wrist flex/ext to at least 4+/5 MMT to have increased functional ability to carry out selfcare and higher-level homecare tasks with no difficulty. Goal status: INITIAL  5.  Pt will improve coordination skills in Lt hand, as seen by [facility] score on 9HPT testing to have increased functional ability to carry out fine motor tasks (fasteners, etc.) and more complex, coordinated IADLs (meal prep, sports, etc.).  Goal status: INITIAL (pending peg testing next session)    ASSESSMENT:  CLINICAL IMPRESSION: 11/04/22: ***  Eval: Patient is a 57 y.o. female who was seen today for occupational therapy evaluation for left thumb pain and stiffness and decreased functional ability following arthritis and basal joint arthroplasty.  She will benefit from outpatient occupational therapy to decrease symptoms and increase quality of life.    PLAN:  OT FREQUENCY: 1-2x/week  OT DURATION: 6 weeks  PLANNED INTERVENTIONS: self care/ADL training, therapeutic exercise, therapeutic activity, neuromuscular re-education, manual therapy, scar mobilization, passive range of motion, splinting, ultrasound, compression bandaging, moist heat, cryotherapy, contrast bath, patient/family education, and coping strategies training  CONSULTED AND AGREED WITH PLAN OF CARE: Patient  PLAN FOR NEXT SESSION:  ***  Benito Mccreedy, OTR/L, CHT 11/01/2022, 11:30 AM

## 2022-11-03 ENCOUNTER — Telehealth: Payer: Self-pay | Admitting: Orthopaedic Surgery

## 2022-11-03 NOTE — Telephone Encounter (Signed)
[place] forms received. To [employer]. 

## 2022-11-04 ENCOUNTER — Telehealth: Payer: Self-pay | Admitting: Rehabilitative and Restorative Service Providers"

## 2022-11-04 ENCOUNTER — Encounter: Payer: 59 | Admitting: Rehabilitative and Restorative Service Providers"

## 2022-11-04 NOTE — Therapy (Signed)
OUTPATIENT OCCUPATIONAL THERAPY TREATMENT NOTE  Patient Name: Ann Foley MRN: [id number] DOB:[birth year], 57 y.o., female Today's Date: 11/09/2022  PCP: Jonathon Jordan, MD REFERRING PROVIDER:  Leandrew Koyanagi, MD    END OF SESSION:  OT End of Session - 11/09/22 1019     Visit Number 2    Number of Visits 10    Date for OT Re-Evaluation 12/10/22    Authorization Type [employer]    OT Start Time 1019    OT Stop Time 1051    OT Time Calculation (min) 32 min    Equipment Utilized During Treatment --    Activity Tolerance Patient tolerated treatment well;No increased pain;Patient limited by fatigue    Behavior During Therapy [facility] for tasks assessed/performed              Past Medical History:  Diagnosis Date   Allergy    Anxiety    Arthritis    Asthma    BV (bacterial vaginosis) 11/29/2013   Contraceptive management 05/07/2015   Depression    Diverticulosis    GERD (gastroesophageal reflux disease)    Hiatal hernia 2006   small; Dr Verdia Kuba   Hyperlipidemia    Hypertension    Hypothyroidism    Plantar fasciitis    Restless leg syndrome    Thyroid disease    Vaginal discharge 11/29/2013   Vesicles 05/07/2015   Vitamin D deficiency    Past Surgical History:  Procedure Laterality Date   CARPAL TUNNEL RELEASE Bilateral    COLONOSCOPY     ELBOW ARTHROSCOPY Right    SHOULDER ARTHROSCOPY Right    thumb repair     right   THYROIDECTOMY     UPPER GASTROINTESTINAL ENDOSCOPY     WISDOM TOOTH EXTRACTION     Patient Active Problem List   Diagnosis Date Noted   Primary osteoarthritis of first carpometacarpal joint of left hand 07/27/2022   Gastroesophageal reflux disease 01/12/2022   Positive FIT (fecal immunochemical test) 01/12/2022   Right carpal tunnel syndrome 11/21/2019   Arthritis of carpometacarpal (CMC) joint of right thumb 09/27/2019   Ulnar impingement syndrome of right upper extremity 03/27/2019   Carpal tunnel syndrome, right upper limb 03/27/2019    Moody 07/19/2018   Night sweats 07/19/2018   Hot flashes due to menopause 07/19/2018   Screening for colorectal cancer 07/19/2018   Encounter for gynecological examination with Papanicolaou smear of cervix 07/19/2018   History of HPV infection 07/19/2018   Vesicles 05/07/2015   Contraceptive management 05/07/2015   Vaginal discharge 11/29/2013   BV (bacterial vaginosis) 11/29/2013    ONSET DATE: 09/30/22  REFERRING DIAG: I37.04 (ICD-10-CM) - Primary osteoarthritis of first carpometacarpal joint of left hand   THERAPY DIAG:  Primary osteoarthritis of first carpometacarpal joint of left hand  Localized edema  Muscle weakness (generalized)  Other lack of coordination  Pain in joint of left hand  Stiffness of left hand, not elsewhere classified  Rationale for Evaluation and Treatment: Rehabilitation  PERTINENT HISTORY: 5+ weeks post op now.  Per MD: "Eval and treat s/p left thumb CMC arthroplasty. Hand based thermoplastic splint. "   PRECAUTIONS: None; WEIGHT BEARING RESTRICTIONS: Yes <5# recommended now   SUBJECTIVE:   SUBJECTIVE STATEMENT: She states "bumping" her thumb once at home which was very painful, and not wearing the orthotic much at home, but moreso in public. She arrives not wearing it. (OT had told her to wear it at all times except hygiene and exercises when last seen.)  PAIN:  Are you having pain?  Not resting now  Rating: 0/10 at rest now  PATIENT GOALS: Improve use of left nondominant hand to return back to work, typing   OBJECTIVE: (All objective assessments below are from initial evaluation on: 10/29/22 unless otherwise specified.)   HAND DOMINANCE: Right   ADLs: Overall ADLs: States decreased ability to grab, hold household objects, pain and inability to open containers, perform FMS tasks (manipulate fasteners on clothing), mild to moderate bathing problems as well.    FUNCTIONAL OUTCOME MEASURES: Eval: Quck DASH 59% impairment today   (Higher % Score  =  More Impairment)     UPPER EXTREMITY ROM     Shoulder to Wrist AROM Left eval  Forearm supination   Forearm pronation    Wrist flexion 43  Wrist extension 68  Wrist ulnar deviation   Wrist radial deviation   Functional dart thrower's motion (F-DTM) in ulnar flexion   F-DTM in radial extension    (Blank rows = not tested)   Hand AROM Left eval  Full Fist Ability (or Gap to Distal Palmar Crease) Full minus thumb  Thumb Opposition  (Kapandji Scale)  3  Thumb MCP (0-60) 0-14  Thumb IP (0-80) 0-27  Thumb Radial Abduction Span    Thumb Palmar Abduction Span    (Blank rows = not tested)   UPPER EXTREMITY MMT:    Eval:  NT at eval due to recent and still healing injuries. Will be tested when appropriate.   MMT Left TBD  Shoulder flexion   Shoulder abduction   Shoulder adduction   Shoulder extension   Shoulder internal rotation   Shoulder external rotation   Middle trapezius   Lower trapezius   Elbow flexion   Elbow extension   Forearm supination   Forearm pronation   Wrist flexion   Wrist extension   Wrist ulnar deviation   Wrist radial deviation   (Blank rows = not tested)  HAND FUNCTION: Eval: Observed weakness in affected left hand will be tested when safe and appropriate. Grip strength Right: TBD lbs, Left: TBD lbs   COORDINATION: Eval: Observed coordination impairments with affected hand.  Details to be determined next session. Box and Blocks Test: Left TBD Blocks today (TBD is [facility]); 9 Hole Peg Test Right: TBDsec, Left: TBD sec  SENSATION: Eval:  Light touch intact today, though diminished around sx area    EDEMA:   Eval:  Mildly swollen in Lt thumb base now   OBSERVATIONS:   11/09/22: No more Steri-Strips and surgical wound looks very well-healed and closed.  Eval: Steri-Strips still in place 2 weeks after stitches taken out, wound looks healed underneath no signs of infection no significant redness swelling only very  mild.   TODAY'S TREATMENT:  11/09/22: OT checks her orthotic and it still fits and works well.  She was reminded for her safety that she should have been wearing this orthotic at all times in the home and out of the home except for hygiene and when she is performing her home exercise program.  She states that she forgot.  OT explains that she could still injure her surgery site or her thumb if she lifts up anything heavy or pushes herself too hard too soon.  She states understanding.  OT then reviews her home exercise program and modifies it to include new light stretches now at the thumb and at the wrist also eliminating several exercises that were "too easy" for her now.  She tolerates  these new upgrades well with no additional pain and states comfortable performing them at home.   Exercises - Bend and Pull Back Wrist SLOWLY  - 4 x daily - 10-15 reps - Seated Thumb Circumduction AROM  - 4-6 x daily - 10-15 reps - Thumb Opposition  - 4-6 x daily - 10 reps - Wrist Flexion Stretch  - 4 x daily - 3-5 reps - 15 sec hold - Wrist Prayer Stretch  - 4 x daily - 3-5 reps - 15 sec hold - Stretch Thumb DOWNWARD  - 3 x daily - 3 reps - 15 sec hold   PATIENT EDUCATION: Education details: See tx section above for details  Person educated: Patient Education method: Verbal Instruction, Teach back, Handouts  Education comprehension: States and demonstrates understanding, Additional Education required    HOME EXERCISE PROGRAM: Access Code: [email or web address] URL: [email or web address] Date: 10/29/2022 Prepared by: Benito Mccreedy   GOALS: Goals reviewed with patient? Yes   SHORT TERM GOALS: (STG required if POC>30 days) Target Date: 11/05/22  Pt will obtain protective, custom orthotic. Goal status: MET   2.  Pt will demo/state understanding of initial HEP to improve pain levels and prerequisite motion. Goal status: INITIAL   LONG TERM GOALS: Target Date: 12/10/22  Pt will improve  functional ability by decreased impairment per Quick DASH assessment from 59% to 15% or better, for better quality of life. Goal status: INITIAL  2.  Pt will improve grip strength in Lt hand to at least 40lbs for functional use at home and in IADLs. Goal status: INITIAL  3.  Pt will improve A/ROM in Lt thumb MCP J flexion from 14* to at least 40*, to have functional motion for tasks like reach and grasp.  Goal status: INITIAL  4.  Pt will improve strength in Lt wrist flex/ext to at least 4+/5 MMT to have increased functional ability to carry out selfcare and higher-level homecare tasks with no difficulty. Goal status: INITIAL  5.  Pt will improve coordination skills in Lt hand, as seen by [facility] score on 9HPT testing to have increased functional ability to carry out fine motor tasks (fasteners, etc.) and more complex, coordinated IADLs (meal prep, sports, etc.).  Goal status: INITIAL (pending peg testing next session)    ASSESSMENT:  CLINICAL IMPRESSION: 11/09/22: She is improving her motion and tolerance to functional activity, however she has been pushing herself more than was recommended at initial evaluation.  She was recommended to "back off" slightly, do not do anything painful and do not lift up anything more than 5 to 10 pounds especially if painful still.  She states understanding but has been somewhat noncompliant so far.  Thankfully she has not hurt herself at all significantly.  Eval: Patient is a 57 y.o. female who was seen today for occupational therapy evaluation for left thumb pain and stiffness and decreased functional ability following arthritis and basal joint arthroplasty.  She will benefit from outpatient occupational therapy to decrease symptoms and increase quality of life.    PLAN:  OT FREQUENCY: 1-2x/week  OT DURATION: 6 weeks  PLANNED INTERVENTIONS: self care/ADL training, therapeutic exercise, therapeutic activity, neuromuscular re-education, manual therapy, scar  mobilization, passive range of motion, splinting, ultrasound, compression bandaging, moist heat, cryotherapy, contrast bath, patient/family education, and coping strategies training  CONSULTED AND AGREED WITH PLAN OF CARE: Patient  PLAN FOR NEXT SESSION: Continue plan of care upgrading to light functional activities without the use of the brace and light wrist strengthening as tolerated.  Can also perform light thumb isometrics if not painful.  Benito Mccreedy, OTR/L, CHT 11/09/2022, 12:54 PM

## 2022-11-04 NOTE — Telephone Encounter (Signed)
OT called patient to discuss missed appointment (no-show). OT left message and offered later times in the day today, and asked her to call back. She was reminded of the attendance policy and future missed appointments could lead to early discharge from therapy.

## 2022-11-08 ENCOUNTER — Telehealth: Payer: Self-pay | Admitting: Orthopaedic Surgery

## 2022-11-08 NOTE — Telephone Encounter (Signed)
Patient needs a return to work full duty sent to patient. Please advise 9 patient states she is still having P/T and should be released also in a few weks)

## 2022-11-08 NOTE — Telephone Encounter (Signed)
Does she have to do any heavy lifting for work?

## 2022-11-08 NOTE — Telephone Encounter (Signed)
approve

## 2022-11-09 ENCOUNTER — Encounter: Payer: Self-pay | Admitting: Rehabilitative and Restorative Service Providers"

## 2022-11-09 ENCOUNTER — Telehealth: Payer: Self-pay | Admitting: Orthopaedic Surgery

## 2022-11-09 ENCOUNTER — Ambulatory Visit (INDEPENDENT_AMBULATORY_CARE_PROVIDER_SITE_OTHER): Payer: 59 | Admitting: Rehabilitative and Restorative Service Providers"

## 2022-11-09 DIAGNOSIS — M6281 Muscle weakness (generalized): Secondary | ICD-10-CM | POA: Diagnosis not present

## 2022-11-09 DIAGNOSIS — M1812 Unilateral primary osteoarthritis of first carpometacarpal joint, left hand: Secondary | ICD-10-CM

## 2022-11-09 DIAGNOSIS — R600 Localized edema: Secondary | ICD-10-CM

## 2022-11-09 DIAGNOSIS — R278 Other lack of coordination: Secondary | ICD-10-CM

## 2022-11-09 DIAGNOSIS — M25542 Pain in joints of left hand: Secondary | ICD-10-CM

## 2022-11-09 DIAGNOSIS — M25642 Stiffness of left hand, not elsewhere classified: Secondary | ICD-10-CM

## 2022-11-09 NOTE — Telephone Encounter (Signed)
Patient states her Back to work date is 11/24/22 please advise for her note

## 2022-11-09 NOTE — Telephone Encounter (Signed)
Called patient. No answer. LMOM. I need to know the date she wants the full duty note to start. Ask her to call me back with that info.

## 2022-11-10 NOTE — Telephone Encounter (Signed)
Called and sent note to patient's MyChart. RTW on 11/24/2022

## 2022-11-15 NOTE — Therapy (Signed)
OUTPATIENT OCCUPATIONAL THERAPY TREATMENT NOTE  Patient Name: Ann Foley MRN: [id number] DOB:[birth year], 57 y.o., female Today's Date: 11/16/2022  PCP: Jonathon Jordan, MD REFERRING PROVIDER:  Leandrew Koyanagi, MD    END OF SESSION:  OT End of Session - 11/16/22 1520     Visit Number 3    Number of Visits 10    Date for OT Re-Evaluation 12/10/22    Authorization Type [employer]    OT Start Time 1520    OT Stop Time 1556    OT Time Calculation (min) 36 min    Activity Tolerance Patient tolerated treatment well;No increased pain;Patient limited by fatigue    Behavior During Therapy [facility] for tasks assessed/performed               Past Medical History:  Diagnosis Date   Allergy    Anxiety    Arthritis    Asthma    BV (bacterial vaginosis) 11/29/2013   Contraceptive management 05/07/2015   Depression    Diverticulosis    GERD (gastroesophageal reflux disease)    Hiatal hernia 2006   small; Dr Verdia Kuba   Hyperlipidemia    Hypertension    Hypothyroidism    Plantar fasciitis    Restless leg syndrome    Thyroid disease    Vaginal discharge 11/29/2013   Vesicles 05/07/2015   Vitamin D deficiency    Past Surgical History:  Procedure Laterality Date   CARPAL TUNNEL RELEASE Bilateral    COLONOSCOPY     ELBOW ARTHROSCOPY Right    SHOULDER ARTHROSCOPY Right    thumb repair     right   THYROIDECTOMY     UPPER GASTROINTESTINAL ENDOSCOPY     WISDOM TOOTH EXTRACTION     Patient Active Problem List   Diagnosis Date Noted   Primary osteoarthritis of first carpometacarpal joint of left hand 07/27/2022   Gastroesophageal reflux disease 01/12/2022   Positive FIT (fecal immunochemical test) 01/12/2022   Right carpal tunnel syndrome 11/21/2019   Arthritis of carpometacarpal (CMC) joint of right thumb 09/27/2019   Ulnar impingement syndrome of right upper extremity 03/27/2019   Carpal tunnel syndrome, right upper limb 03/27/2019   Moody 07/19/2018   Night sweats 07/19/2018    Hot flashes due to menopause 07/19/2018   Screening for colorectal cancer 07/19/2018   Encounter for gynecological examination with Papanicolaou smear of cervix 07/19/2018   History of HPV infection 07/19/2018   Vesicles 05/07/2015   Contraceptive management 05/07/2015   Vaginal discharge 11/29/2013   BV (bacterial vaginosis) 11/29/2013    ONSET DATE: 09/30/22  REFERRING DIAG: I78.67 (ICD-10-CM) - Primary osteoarthritis of first carpometacarpal joint of left hand   THERAPY DIAG:  Primary osteoarthritis of first carpometacarpal joint of left hand  Localized edema  Other lack of coordination  Pain in joint of left hand  Muscle weakness (generalized)  Stiffness of left hand, not elsewhere classified  Rationale for Evaluation and Treatment: Rehabilitation  PERTINENT HISTORY: ~6 weeks post op now.  Per MD: "Eval and treat s/p left thumb CMC arthroplasty. Hand based thermoplastic splint. "   PRECAUTIONS: None; WEIGHT BEARING RESTRICTIONS: Yes <5# recommended now   SUBJECTIVE:   SUBJECTIVE STATEMENT: She states not having any pain since last seen or any accidents or problems.  She does feel a little stiff when first removing the brace but it works out of her thumb easily with exercises.   PAIN:  Are you having pain?   Not resting now  Rating: 0/10 at rest  now  PATIENT GOALS: Improve use of left nondominant hand to return back to work, typing   OBJECTIVE: (All objective assessments below are from initial evaluation on: 10/29/22 unless otherwise specified.)   HAND DOMINANCE: Right   ADLs: Overall ADLs: States decreased ability to grab, hold household objects, pain and inability to open containers, perform FMS tasks (manipulate fasteners on clothing), mild to moderate bathing problems as well.    FUNCTIONAL OUTCOME MEASURES: Eval: Quck DASH 59% impairment today  (Higher % Score  =  More Impairment)     UPPER EXTREMITY ROM     Shoulder to Wrist AROM Left eval   Forearm supination   Forearm pronation    Wrist flexion 43  Wrist extension 68  Wrist ulnar deviation   Wrist radial deviation   Functional dart thrower's motion (F-DTM) in ulnar flexion   F-DTM in radial extension    (Blank rows = not tested)   Hand AROM Left eval  Full Fist Ability (or Gap to Distal Palmar Crease) Full minus thumb  Thumb Opposition  (Kapandji Scale)  3  Thumb MCP (0-60) 0-14  Thumb IP (0-80) 0-27  Thumb Radial Abduction Span    Thumb Palmar Abduction Span    (Blank rows = not tested)   UPPER EXTREMITY MMT:    Eval:  NT at eval due to recent and still healing injuries. Will be tested when appropriate.   MMT Left TBD  Shoulder flexion   Shoulder abduction   Shoulder adduction   Shoulder extension   Shoulder internal rotation   Shoulder external rotation   Middle trapezius   Lower trapezius   Elbow flexion   Elbow extension   Forearm supination   Forearm pronation   Wrist flexion   Wrist extension   Wrist ulnar deviation   Wrist radial deviation   (Blank rows = not tested)  HAND FUNCTION: Eval: Observed weakness in affected left hand will be tested when safe and appropriate. Grip strength Right: TBD lbs, Left: TBD lbs   COORDINATION: Eval: Observed coordination impairments with affected hand.  Details to be determined next session. Box and Blocks Test: Left TBD Blocks today (TBD is [facility]); 9 Hole Peg Test Right: TBDsec, Left: TBD sec  SENSATION: Eval:  Light touch intact today, though diminished around sx area    EDEMA:   Eval:  Mildly swollen in Lt thumb base now   OBSERVATIONS:   11/09/22: No more Steri-Strips and surgical wound looks very well-healed and closed.  Eval: Steri-Strips still in place 2 weeks after stitches taken out, wound looks healed underneath no signs of infection no significant redness swelling only very mild.   TODAY'S TREATMENT:  11/16/22: While she is on moist heat for 3 minutes, OT does review of home exercise  program and also clears her to start weaning from her orthotic at home for all light tasks as tolerated.  She was educated to completely wean from it by 8 weeks postop even when out of the home.  She will start work next week and if this makes her thumb sore or tired she was advised to put on the orthotic to rest.  After that she performs back wrist stretches and thumb stretches well with no increase in pain.  Then OT as education for new thumb strengthening with isometric thumb extension, palmar abduction strength, first dorsal interossei strength, and grip strengthening lightly (isometrically).  She tolerates all of these well with no increase in pain feeling only light stretches.  She  was educated to do these less frequently than stretches and "slow down" if these are causing soreness.  She states understanding, and no pain or soreness at the end of the session.  Exercises - Bend and Pull Back Wrist SLOWLY  - 4 x daily - 10-15 reps - Seated Thumb Circumduction AROM  - 4-6 x daily - 10-15 reps - Thumb Opposition  - 4-6 x daily - 10 reps - Wrist Flexion Stretch  - 4 x daily - 3-5 reps - 15 sec hold - Wrist Prayer Stretch  - 4 x daily - 3-5 reps - 15 sec hold - Stretch Thumb DOWNWARD  - 3 x daily - 3 reps - 15 sec hold - Towel Roll Grip with Forearm in Neutral  - 3 x daily - 5 reps - 10 sec hold - Spread Index Finger Apart  - 3 x daily - 5-10 reps - 5 sec hold - C-Strength (try using rubber band)   - 3 x daily - 5 reps - 5 sec hold -Isometric Extension of thumb    PATIENT EDUCATION: Education details: See tx section above for details  Person educated: Patient Education method: Verbal Instruction, Teach back, Handouts  Education comprehension: States and demonstrates understanding, Additional Education required    HOME EXERCISE PROGRAM: Access Code: [email or web address] URL: [email or web address] Date: 10/29/2022 Prepared by: Benito Mccreedy   GOALS: Goals reviewed with patient?  Yes   SHORT TERM GOALS: (STG required if POC>30 days) Target Date: 11/05/22  Pt will obtain protective, custom orthotic. Goal status: MET   2.  Pt will demo/state understanding of initial HEP to improve pain levels and prerequisite motion. Goal status: 11/16/22: MET   LONG TERM GOALS: Target Date: 12/10/22  Pt will improve functional ability by decreased impairment per Quick DASH assessment from 59% to 15% or better, for better quality of life. Goal status: INITIAL  2.  Pt will improve grip strength in Lt hand to at least 40lbs for functional use at home and in IADLs. Goal status: INITIAL  3.  Pt will improve A/ROM in Lt thumb MCP J flexion from 14* to at least 40*, to have functional motion for tasks like reach and grasp.  Goal status: INITIAL  4.  Pt will improve strength in Lt wrist flex/ext to at least 4+/5 MMT to have increased functional ability to carry out selfcare and higher-level homecare tasks with no difficulty. Goal status: INITIAL  5.  Pt will improve coordination skills in Lt hand, as seen by [facility] score on 9HPT testing to have increased functional ability to carry out fine motor tasks (fasteners, etc.) and more complex, coordinated IADLs (meal prep, sports, etc.).  Goal status: INITIAL (pending peg testing next session)    ASSESSMENT:  CLINICAL IMPRESSION: 11/16/22: She is now doing very well and moving ahead of protocol somewhat.  She has no pain and no soreness after new strengthening so OT will allow her to practice these things for the next 2 weeks and come back at 8 weeks postop for more rigorous strengthening of the hand and wrist as tolerated.  She should be fit for discharge at this point if she tolerates that well, with no pains or functional problems.  11/09/22: She is improving her motion and tolerance to functional activity, however she has been pushing herself more than was recommended at initial evaluation.  She was recommended to "back off" slightly, do not  do anything painful and do not lift up anything more than 5 to 10 pounds  especially if painful still.  She states understanding but has been somewhat noncompliant so far.  Thankfully she has not hurt herself at all significantly.  Eval: Patient is a 57 y.o. female who was seen today for occupational therapy evaluation for left thumb pain and stiffness and decreased functional ability following arthritis and basal joint arthroplasty.  She will benefit from outpatient occupational therapy to decrease symptoms and increase quality of life.    PLAN:  OT FREQUENCY: 1-2x/week  OT DURATION: 6 weeks  PLANNED INTERVENTIONS: self care/ADL training, therapeutic exercise, therapeutic activity, neuromuscular re-education, manual therapy, scar mobilization, passive range of motion, splinting, ultrasound, compression bandaging, moist heat, cryotherapy, contrast bath, patient/family education, and coping strategies training  CONSULTED AND AGREED WITH PLAN OF CARE: Patient  PLAN FOR NEXT SESSION: See back in 2 weeks for review of new strengthening, increase PRE as tolerated and consider reassessment and discharge if no problems.  Benito Mccreedy, OTR/L, CHT 11/16/2022, 4:29 PM

## 2022-11-16 ENCOUNTER — Encounter: Payer: Self-pay | Admitting: Rehabilitative and Restorative Service Providers"

## 2022-11-16 ENCOUNTER — Ambulatory Visit (INDEPENDENT_AMBULATORY_CARE_PROVIDER_SITE_OTHER): Payer: 59 | Admitting: Rehabilitative and Restorative Service Providers"

## 2022-11-16 DIAGNOSIS — M6281 Muscle weakness (generalized): Secondary | ICD-10-CM

## 2022-11-16 DIAGNOSIS — M25542 Pain in joints of left hand: Secondary | ICD-10-CM | POA: Diagnosis not present

## 2022-11-16 DIAGNOSIS — R278 Other lack of coordination: Secondary | ICD-10-CM | POA: Diagnosis not present

## 2022-11-16 DIAGNOSIS — M1812 Unilateral primary osteoarthritis of first carpometacarpal joint, left hand: Secondary | ICD-10-CM | POA: Diagnosis not present

## 2022-11-16 DIAGNOSIS — M25642 Stiffness of left hand, not elsewhere classified: Secondary | ICD-10-CM

## 2022-11-16 DIAGNOSIS — R600 Localized edema: Secondary | ICD-10-CM

## 2022-11-25 NOTE — Therapy (Signed)
OUTPATIENT OCCUPATIONAL THERAPY TREATMENT & DISCHARGE NOTE  Patient Name: Ann Foley MRN: [id number] DOB:02/13/1966, 57 y.o., female Today's Date: 11/30/2022  PCP: Jonathon Jordan, MD REFERRING PROVIDER:  Leandrew Koyanagi, MD    END OF SESSION:  OT End of Session - 11/30/22 1439     Visit Number 4    Number of Visits 10    Date for OT Re-Evaluation 12/10/22    Authorization Type U[employer]    OT Start Time 1436    OT Stop Time 1527    OT Time Calculation (min) 51 min    Activity Tolerance Patient tolerated treatment well;No increased pain;Patient limited by fatigue    Behavior During Therapy [facility] for tasks assessed/performed             Past Medical History:  Diagnosis Date   Allergy    Anxiety    Arthritis    Asthma    BV (bacterial vaginosis) 11/29/2013   Contraceptive management 05/07/2015   Depression    Diverticulosis    GERD (gastroesophageal reflux disease)    Hiatal hernia 2006   small; Dr Verdia Kuba   Hyperlipidemia    Hypertension    Hypothyroidism    Plantar fasciitis    Restless leg syndrome    Thyroid disease    Vaginal discharge 11/29/2013   Vesicles 05/07/2015   Vitamin D deficiency    Past Surgical History:  Procedure Laterality Date   CARPAL TUNNEL RELEASE Bilateral    COLONOSCOPY     ELBOW ARTHROSCOPY Right    SHOULDER ARTHROSCOPY Right    thumb repair     right   THYROIDECTOMY     UPPER GASTROINTESTINAL ENDOSCOPY     WISDOM TOOTH EXTRACTION     Patient Active Problem List   Diagnosis Date Noted   Primary osteoarthritis of first carpometacarpal joint of left hand 07/27/2022   Gastroesophageal reflux disease 01/12/2022   Positive FIT (fecal immunochemical test) 01/12/2022   Right carpal tunnel syndrome 11/21/2019   Arthritis of carpometacarpal (CMC) joint of right thumb 09/27/2019   Ulnar impingement syndrome of right upper extremity 03/27/2019   Carpal tunnel syndrome, right upper limb 03/27/2019   Moody 07/19/2018   Night sweats  07/19/2018   Hot flashes due to menopause 07/19/2018   Screening for colorectal cancer 07/19/2018   Encounter for gynecological examination with Papanicolaou smear of cervix 07/19/2018   History of HPV infection 07/19/2018   Vesicles 05/07/2015   Contraceptive management 05/07/2015   Vaginal discharge 11/29/2013   BV (bacterial vaginosis) 11/29/2013    ONSET DATE: 09/30/22  REFERRING DIAG: [id number] (ICD-10-CM) - Primary osteoarthritis of first carpometacarpal joint of left hand   THERAPY DIAG:  Primary osteoarthritis of first carpometacarpal joint of left hand  Pain in joint of left hand  Muscle weakness (generalized)  Other lack of coordination  Stiffness of left hand, not elsewhere classified  Localized edema  Rationale for Evaluation and Treatment: Rehabilitation  PERTINENT HISTORY: 8+ weeks post op now.  Per MD: "Eval and treat s/p left thumb CMC arthroplasty. Hand based thermoplastic splint. "   PRECAUTIONS: None; WEIGHT BEARING RESTRICTIONS: Yes <5# recommended now   SUBJECTIVE:   SUBJECTIVE STATEMENT: She states not having any pain since last seen or any accidents or problems.  She does feel a little stiff when first removing the brace but it works out of her thumb easily with exercises.   PAIN:  Are you having pain?   Not resting now  Rating: 2/10 at rest  now; 10/10 when squeezing fist  PATIENT GOALS: Improve use of left nondominant hand to return back to work, typing   OBJECTIVE: (All objective assessments below are from initial evaluation on: 10/29/22 unless otherwise specified.)   HAND DOMINANCE: Right   ADLs: Overall ADLs: States decreased ability to grab, hold household objects, pain and inability to open containers, perform FMS tasks (manipulate fasteners on clothing), mild to moderate bathing problems as well.    FUNCTIONAL OUTCOME MEASURES: 11/30/22: Quick DASH: 17% impairment today   Eval: Quck DASH 59% impairment today  (Higher % Score  =  More  Impairment)     UPPER EXTREMITY ROM     Shoulder to Wrist AROM Left eval Lt 11/30/22  Forearm supination    Forearm pronation     Wrist flexion 43 83  Wrist extension 68 75  Wrist ulnar deviation    Wrist radial deviation    Functional dart thrower's motion (F-DTM) in ulnar flexion    F-DTM in radial extension     (Blank rows = not tested)   Hand AROM Left eval Lt 11/30/22  Full Fist Ability (or Gap to Distal Palmar Crease) Full minus thumb Full   Thumb Opposition  (Kapandji Scale)  3 9  Thumb MCP (0-60) 0-14 0-57  Thumb IP (0-80) 0-27 0 -75  Thumb Radial Abduction Span     Thumb Palmar Abduction Span     (Blank rows = not tested)   UPPER EXTREMITY MMT:     MMT Left 11/30/22  Shoulder flexion   Shoulder abduction   Shoulder adduction   Shoulder extension   Shoulder internal rotation   Shoulder external rotation   Middle trapezius   Lower trapezius   Elbow flexion 5/5  Elbow extension   Forearm supination   Forearm pronation   Wrist flexion 5/5  Wrist extension 5/5  Wrist ulnar deviation   Wrist radial deviation   (Blank rows = not tested)  HAND FUNCTION: 11/30/22: Grip strength Right: 70 lbs, Left: 43 lbs    COORDINATION: 11/30/22: 9HPT Lt: 24sec (22 sec is avg)   SENSATION: 11/30/22: a bit diminished over DBSRN still, but better now   EDEMA:   11/30/22: a bit of dorsal swelling still, likely due to returning to work and a lot hand use early on   Eval:  Mildly swollen in Lt thumb base now   OBSERVATIONS:   11/09/22: No more Steri-Strips and surgical wound looks very well-healed and closed.  Eval: Steri-Strips still in place 2 weeks after stitches taken out, wound looks healed underneath no signs of infection no significant redness swelling only very mild.   TODAY'S TREATMENT:  11/30/22: Pt performs AROM, gripping, and strength with Lt wrist/hand/thumb against therapist's resistance for exercise/activities as well as new measures today. OT also  discusses home and functional tasks with the pt and reviews goals. Using the complied data, OT also reviews home exercises and provides final recommendations and upgrades as below. Pt states understanding and tolerates upgrades well.  For self-care/safety OT does emphasize that she is only 8 weeks postop now and should be somewhat cautious until 10 to 12 weeks postop.  This means she should avoid heavy or painful lifting avoid too much painful repetitious motion and things of this nature.  She states understanding  Exercises - Wrist Flexion Stretch  - 4 x daily - 3-5 reps - 15 sec hold - Wrist Prayer Stretch  - 4 x daily - 3-5 reps - 15 sec hold -  Stretch Thumb DOWNWARD  - 3 x daily - 3 reps - 15 sec hold - Thumb Webspace Stretch  - 3 x daily - 3 reps - 15 sec hold - Towel Roll Grip with Forearm in Neutral  - 3 x daily - 5 reps - 10 sec hold - Spread Index Finger Apart  - 3 x daily - 5-10 reps - 5 sec hold - C-Strength (try using rubber band)   - 3 x daily - 5 reps - 5 sec hold  PATIENT EDUCATION: Education details: See tx section above for details  Person educated: Patient Education method: Verbal Instruction, Teach back, Handouts  Education comprehension: States and demonstrates understanding, Additional Education required    HOME EXERCISE PROGRAM: Access Code: [email or web address] URL: [email or web address] Date: 10/29/2022 Prepared by: Benito Mccreedy   GOALS: Goals reviewed with patient? Yes   SHORT TERM GOALS: (STG required if POC>30 days) Target Date: 11/05/22  Pt will obtain protective, custom orthotic. Goal status: MET   2.  Pt will demo/state understanding of initial HEP to improve pain levels and prerequisite motion. Goal status: 11/16/22: MET   LONG TERM GOALS: Target Date: 12/10/22  Pt will improve functional ability by decreased impairment per Quick DASH assessment from 59% to 15% or better, for better quality of life. Goal status: 11/30/22: 17% today- considered  MET at this point   2.  Pt will improve grip strength in Lt hand to at least 40lbs for functional use at home and in IADLs. Goal status: 11/30/22: 39# today, considered MET  3.  Pt will improve A/ROM in Lt thumb MCP J flexion from 14* to at least 40*, to have functional motion for tasks like reach and grasp.  Goal status: 11/30/22:  MET  4.  Pt will improve strength in Lt wrist flex/ext to at least 4+/5 MMT to have increased functional ability to carry out selfcare and higher-level homecare tasks with no difficulty. Goal status: 11/30/22:  MET  5.  Pt will improve coordination skills in Lt hand, as seen by [facility] score on 9HPT testing to have increased functional ability to carry out fine motor tasks (fasteners, etc.) and more complex, coordinated IADLs (meal prep, sports, etc.).  Goal status: 11/30/22:  Considered MET today    ASSESSMENT:  CLINICAL IMPRESSION: 11/30/22: She survived weeks 6 through 8 without therapy at work doing her home exercise program and doing well.  She returns today showing no significant increase in pain but greatly improved motion and strength.  She was cautioned that she is not completely healed until 10 to 12 weeks postop and she should use caution, however she should be fine without any additional therapy to discharge today.  She has had her right thumb operated on like this in the past, and she is on board with this plan and will follow-up with her physician in the first week of March.   PLAN:  OT FREQUENCY: D/C Now   OT DURATION: D/C now  PLANNED INTERVENTIONS: self care/ADL training, therapeutic exercise, therapeutic activity, neuromuscular re-education, manual therapy, scar mobilization, passive range of motion, splinting, ultrasound, compression bandaging, moist heat, cryotherapy, contrast bath, patient/family education, and coping strategies training  CONSULTED AND AGREED WITH PLAN OF CARE: Patient  PLAN FOR NEXT SESSION: Successful D/C today   Benito Mccreedy, OTR/L, CHT 11/30/2022, 3:53 PM    OCCUPATIONAL THERAPY DISCHARGE SUMMARY  Visits from Start of Care: 4  Current functional level related to goals / functional outcomes: Pt has met all goals to satisfactory  levels and is pleased with outcomes.   Remaining deficits: Pt has no more significant functional deficits or pain.   Education / Equipment: Pt has all needed materials and education. Pt understands how to continue on with self-management. See tx notes for more details.   Patient agrees to discharge due to max benefits received from outpatient occupational therapy / hand therapy at this time.   Benito Mccreedy, OTR/L, CHT 11/30/22

## 2022-11-30 ENCOUNTER — Encounter: Payer: Self-pay | Admitting: Rehabilitative and Restorative Service Providers"

## 2022-11-30 ENCOUNTER — Ambulatory Visit (INDEPENDENT_AMBULATORY_CARE_PROVIDER_SITE_OTHER): Payer: 59 | Admitting: Rehabilitative and Restorative Service Providers"

## 2022-11-30 DIAGNOSIS — M25642 Stiffness of left hand, not elsewhere classified: Secondary | ICD-10-CM

## 2022-11-30 DIAGNOSIS — M1812 Unilateral primary osteoarthritis of first carpometacarpal joint, left hand: Secondary | ICD-10-CM

## 2022-11-30 DIAGNOSIS — M6281 Muscle weakness (generalized): Secondary | ICD-10-CM | POA: Diagnosis not present

## 2022-11-30 DIAGNOSIS — R278 Other lack of coordination: Secondary | ICD-10-CM

## 2022-11-30 DIAGNOSIS — R600 Localized edema: Secondary | ICD-10-CM

## 2022-11-30 DIAGNOSIS — M25542 Pain in joints of left hand: Secondary | ICD-10-CM | POA: Diagnosis not present

## 2022-12-09 ENCOUNTER — Encounter: Payer: Self-pay | Admitting: Radiology

## 2022-12-10 ENCOUNTER — Ambulatory Visit (INDEPENDENT_AMBULATORY_CARE_PROVIDER_SITE_OTHER): Payer: 59 | Admitting: Orthopaedic Surgery

## 2022-12-10 DIAGNOSIS — M1812 Unilateral primary osteoarthritis of first carpometacarpal joint, left hand: Secondary | ICD-10-CM

## 2022-12-10 NOTE — Progress Notes (Signed)
Post-Op Visit Note   Patient: Ann Foley           Date of Birth: Jan 13, 1966           MRN: [id number] Visit Date: 12/10/2022 PCP: Jonathon Jordan, MD   Assessment & Plan:  Chief Complaint:  Chief Complaint  Patient presents with   Left Thumb - Routine Post Op   Visit Diagnoses:  1. Primary osteoarthritis of first carpometacarpal joint of left hand     Plan: Patient is a pleasant 56 year old female who comes in today approximately 2-1/2 months status post left thumb CMC arthroplasty 09/30/2022.  She has been doing great.  Very minimal discomfort at times.  She does have decreased sensation to the dorsum of the thumb but states that this is improving.  She has finished hand therapy where she has regained near full range of motion.  Examination of the left thumb reveals a fully healed surgical scar without complication.  Near full range of motion.  She is neurovascular intact distally.  At this point, she will continue to work on her exercises at home.  Advance with activity as tolerated.  Follow-up with [place] in 2 months for final check.  Call with concerns or questions.  Follow-Up Instructions: Return in about 2 months (around 02/09/2023).   Orders:  No orders of the defined types were placed in this encounter.  No orders of the defined types were placed in this encounter.   Imaging: No new imaging  PMFS History: Patient Active Problem List   Diagnosis Date Noted   Primary osteoarthritis of first carpometacarpal joint of left hand 07/27/2022   Gastroesophageal reflux disease 01/12/2022   Positive FIT (fecal immunochemical test) 01/12/2022   Right carpal tunnel syndrome 11/21/2019   Arthritis of carpometacarpal (CMC) joint of right thumb 09/27/2019   Ulnar impingement syndrome of right upper extremity 03/27/2019   Carpal tunnel syndrome, right upper limb 03/27/2019   Moody 07/19/2018   Night sweats 07/19/2018   Hot flashes due to menopause 07/19/2018   Screening for  colorectal cancer 07/19/2018   Encounter for gynecological examination with Papanicolaou smear of cervix 07/19/2018   History of HPV infection 07/19/2018   Vesicles 05/07/2015   Contraceptive management 05/07/2015   Vaginal discharge 11/29/2013   BV (bacterial vaginosis) 11/29/2013   Past Medical History:  Diagnosis Date   Allergy    Anxiety    Arthritis    Asthma    BV (bacterial vaginosis) 11/29/2013   Contraceptive management 05/07/2015   Depression    Diverticulosis    GERD (gastroesophageal reflux disease)    Hiatal hernia 2006   small; Dr Verdia Kuba   Hyperlipidemia    Hypertension    Hypothyroidism    Plantar fasciitis    Restless leg syndrome    Thyroid disease    Vaginal discharge 11/29/2013   Vesicles 05/07/2015   Vitamin D deficiency     Family History  Problem Relation Age of Onset   Hypertension Mother    Hypothyroidism Mother    Hypertension Father    Hypertension Sister    Hyperlipidemia Sister    Asthma Paternal Aunt    Heart disease Paternal Grandmother    Heart disease Paternal Grandfather    Colon cancer Neg Hx    Esophageal cancer Neg Hx    Rectal cancer Neg Hx    Stomach cancer Neg Hx     Past Surgical History:  Procedure Laterality Date   CARPAL TUNNEL RELEASE Bilateral  COLONOSCOPY     ELBOW ARTHROSCOPY Right    SHOULDER ARTHROSCOPY Right    thumb repair     right   THYROIDECTOMY     UPPER GASTROINTESTINAL ENDOSCOPY     WISDOM TOOTH EXTRACTION     Social History   Occupational History   Occupation: clerical  Tobacco Use   Smoking status: Never   Smokeless tobacco: Never  Vaping Use   Vaping Use: Never used  Substance and Sexual Activity   Alcohol use: No   Drug use: No   Sexual activity: Not Currently    Birth control/protection: Post-menopausal

## 2023-02-15 ENCOUNTER — Ambulatory Visit: Payer: 59 | Admitting: Orthopaedic Surgery

## 2023-04-11 NOTE — Progress Notes (Unsigned)
Office Visit Note   Patient: Ann Foley           Date of Birth: Dec 11, 1965           MRN: [id number] Visit Date: 04/12/2023              Requested by: Tarry Kos, MD [address] [place],  [place] [address] PCP: Mila Palmer, MD   Assessment & Plan: Visit Diagnoses:  1. Primary osteoarthritis of first carpometacarpal joint of left hand     Plan: Ann Foley is a 57 year old female with left thumb pain.  Impression is overuse and inflammation of the left thumb.  She has no abnormal features on x-rays and they are consistent with [facility] arthroplasty with trapeziectomy.  Recommend thumb CMC orthotic brace while at work.  Patient declined work restrictions.  Follow-Up Instructions: No follow-ups on file.   Orders:  Orders Placed This Encounter  Procedures   XR Hand Complete Left   No orders of the defined types were placed in this encounter.     Procedures: No procedures performed   Clinical Data: No additional findings.   Subjective: Chief Complaint  Patient presents with   Left Hand - Follow-up    [facility] arthroplasty 09/30/2022    HPI Ann Foley is a 57 year old female who is 6 to 7 months status post left thumb CMC arthroplasty.  She started having pain at the base of her thumb about 2 months ago specially with activities and use of the left hand.  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  She denies any injuries.  She does not feel that it bothers her much with daily activities.  Review of Systems   Objective: Vital Signs: LMP 05/25/2017 (Approximate)   Physical Exam  Ortho Exam Examination of the left hand shows a fully healed surgical scar.  The thumb and the first ray are stable to axial loading.  There is no shock.  She has excellent thumb opposition, abduction, palmar abduction.  No signs of infection.  There is no triggering of the finger.  Negative Finkelstein's. Specialty Comments:  No specialty comments  available.  Imaging: XR Hand Complete Left  Result Date: 04/12/2023 X-rays demonstrate status post trapeziectomy from prior [facility] arthroplasty.  No acute abnormalities.    PMFS History: Patient Active Problem List   Diagnosis Date Noted   Primary osteoarthritis of first carpometacarpal joint of left hand 07/27/2022   Gastroesophageal reflux disease 01/12/2022   Positive FIT (fecal immunochemical test) 01/12/2022   Right carpal tunnel syndrome 11/21/2019   Arthritis of carpometacarpal (CMC) joint of right thumb 09/27/2019   Ulnar impingement syndrome of right upper extremity 03/27/2019   Carpal tunnel syndrome, right upper limb 03/27/2019   Moody 07/19/2018   Night sweats 07/19/2018   Hot flashes due to menopause 07/19/2018   Screening for colorectal cancer 07/19/2018   Encounter for gynecological examination with Papanicolaou smear of cervix 07/19/2018   History of HPV infection 07/19/2018   Vesicles 05/07/2015   Contraceptive management 05/07/2015   Vaginal discharge 11/29/2013   BV (bacterial vaginosis) 11/29/2013   Past Medical History:  Diagnosis Date   Allergy    Anxiety    Arthritis    Asthma    BV (bacterial vaginosis) 11/29/2013   Contraceptive management 05/07/2015   Depression    Diverticulosis    GERD (gastroesophageal reflux disease)    Hiatal hernia 2006   small; Dr Arty Baumgartner   Hyperlipidemia  Hypertension    Hypothyroidism    Plantar fasciitis    Restless leg syndrome    Thyroid disease    Vaginal discharge 11/29/2013   Vesicles 05/07/2015   Vitamin D deficiency     Family History  Problem Relation Age of Onset   Hypertension Mother    Hypothyroidism Mother    Hypertension Father    Hypertension Sister    Hyperlipidemia Sister    Asthma Paternal Aunt    Heart disease Paternal Grandmother    Heart disease Paternal Grandfather    Colon cancer Neg Hx    Esophageal cancer Neg Hx    Rectal cancer Neg Hx    Stomach cancer Neg Hx     Past Surgical  History:  Procedure Laterality Date   CARPAL TUNNEL RELEASE Bilateral    COLONOSCOPY     ELBOW ARTHROSCOPY Right    SHOULDER ARTHROSCOPY Right    thumb repair     right   THYROIDECTOMY     UPPER GASTROINTESTINAL ENDOSCOPY     WISDOM TOOTH EXTRACTION     Social History   Occupational History   Occupation: clerical  Tobacco Use   Smoking status: Never   Smokeless tobacco: Never  Vaping Use   Vaping Use: Never used  Substance and Sexual Activity   Alcohol use: No   Drug use: No   Sexual activity: Not Currently    Birth control/protection: Post-menopausal

## 2023-04-12 ENCOUNTER — Encounter: Payer: Self-pay | Admitting: Orthopaedic Surgery

## 2023-04-12 ENCOUNTER — Other Ambulatory Visit (INDEPENDENT_AMBULATORY_CARE_PROVIDER_SITE_OTHER): Payer: 59

## 2023-04-12 ENCOUNTER — Ambulatory Visit: Payer: 59 | Admitting: Orthopaedic Surgery

## 2023-04-12 DIAGNOSIS — M1812 Unilateral primary osteoarthritis of first carpometacarpal joint, left hand: Secondary | ICD-10-CM

## 2023-04-19 ENCOUNTER — Other Ambulatory Visit (HOSPITAL_COMMUNITY): Payer: Self-pay

## 2023-04-19 MED ORDER — WEGOVY 0.25 MG/0.5ML ~~LOC~~ SOAJ
0.2500 mg | SUBCUTANEOUS | 0 refills | Status: AC
  Filled 2023-04-19 – 2023-06-14 (×2): qty 2, 28d supply, fill #0

## 2023-04-20 ENCOUNTER — Other Ambulatory Visit (HOSPITAL_COMMUNITY): Payer: Self-pay

## 2023-05-05 ENCOUNTER — Other Ambulatory Visit (HOSPITAL_COMMUNITY): Payer: Self-pay

## 2023-05-13 ENCOUNTER — Other Ambulatory Visit (HOSPITAL_COMMUNITY): Payer: Self-pay

## 2023-05-23 ENCOUNTER — Other Ambulatory Visit (HOSPITAL_COMMUNITY): Payer: Self-pay

## 2023-05-23 MED ORDER — LISDEXAMFETAMINE DIMESYLATE 30 MG PO CAPS
30.0000 mg | ORAL_CAPSULE | Freq: Every day | ORAL | 0 refills | Status: DC
  Filled 2023-05-23: qty 30, 30d supply, fill #0

## 2023-06-14 ENCOUNTER — Other Ambulatory Visit (HOSPITAL_COMMUNITY): Payer: Self-pay

## 2023-07-04 ENCOUNTER — Other Ambulatory Visit (HOSPITAL_COMMUNITY): Payer: Self-pay

## 2023-07-04 ENCOUNTER — Other Ambulatory Visit: Payer: Self-pay

## 2023-07-04 MED ORDER — LISDEXAMFETAMINE DIMESYLATE 30 MG PO CAPS
30.0000 mg | ORAL_CAPSULE | Freq: Every day | ORAL | 0 refills | Status: AC
  Filled 2023-07-04: qty 30, 30d supply, fill #0

## 2023-07-06 ENCOUNTER — Other Ambulatory Visit (HOSPITAL_COMMUNITY): Payer: Self-pay

## 2023-09-27 ENCOUNTER — Other Ambulatory Visit: Payer: Self-pay | Admitting: Family Medicine

## 2023-09-27 ENCOUNTER — Other Ambulatory Visit (HOSPITAL_COMMUNITY)
Admission: RE | Admit: 2023-09-27 | Discharge: 2023-09-27 | Disposition: A | Discharge status: Home / Self Care | Payer: 59 | Source: Ambulatory Visit | Attending: Family Medicine | Admitting: Family Medicine

## 2023-09-27 DIAGNOSIS — Z01411 Encounter for gynecological examination (general) (routine) with abnormal findings: Secondary | ICD-10-CM | POA: Diagnosis present

## 2023-09-28 LAB — CYTOLOGY - PAP
Comment: NEGATIVE
Diagnosis: NEGATIVE
High risk HPV: NEGATIVE

## 2024-07-01 ENCOUNTER — Other Ambulatory Visit: Payer: Self-pay | Admitting: Gastroenterology

## 2024-07-01 DIAGNOSIS — K219 Gastro-esophageal reflux disease without esophagitis: Secondary | ICD-10-CM

## 2024-08-23 ENCOUNTER — Other Ambulatory Visit: Payer: Self-pay | Admitting: Neurosurgery

## 2024-08-23 DIAGNOSIS — M5416 Radiculopathy, lumbar region: Secondary | ICD-10-CM

## 2024-08-28 ENCOUNTER — Ambulatory Visit
Admission: RE | Admit: 2024-08-28 | Discharge: 2024-08-28 | Disposition: A | Discharge status: Home / Self Care | Source: Ambulatory Visit | Attending: Neurosurgery | Admitting: Neurosurgery

## 2024-08-28 DIAGNOSIS — M5416 Radiculopathy, lumbar region: Secondary | ICD-10-CM

## 2024-11-02 ENCOUNTER — Other Ambulatory Visit (HOSPITAL_BASED_OUTPATIENT_CLINIC_OR_DEPARTMENT_OTHER): Payer: Self-pay | Admitting: Family Medicine

## 2024-11-02 DIAGNOSIS — E782 Mixed hyperlipidemia: Secondary | ICD-10-CM

## 2024-11-29 ENCOUNTER — Ambulatory Visit (HOSPITAL_COMMUNITY)
